# Patient Record
Sex: Female | Born: 1985 | Race: Black or African American | Hispanic: No | Marital: Single | Smoking: Former smoker
Health system: Southern US, Community
[De-identification: ages and names within clinical notes are randomized; demographics above are authoritative.]

## PROBLEM LIST (undated history)

## (undated) ENCOUNTER — Inpatient Hospital Stay (HOSPITAL_COMMUNITY): Payer: Self-pay

## (undated) DIAGNOSIS — Z789 Other specified health status: Secondary | ICD-10-CM

## (undated) DIAGNOSIS — I10 Essential (primary) hypertension: Secondary | ICD-10-CM

## (undated) HISTORY — PX: DILATION AND CURETTAGE OF UTERUS: SHX78

## (undated) HISTORY — PX: HERNIA REPAIR: SHX51

---
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
## 2004-11-02 ENCOUNTER — Inpatient Hospital Stay (HOSPITAL_COMMUNITY): Admission: AD | Admit: 2004-11-02 | Discharge: 2004-11-05 | Payer: Self-pay | Admitting: Obstetrics

## 2006-03-14 ENCOUNTER — Emergency Department (HOSPITAL_COMMUNITY): Admission: EM | Admit: 2006-03-14 | Discharge: 2006-03-14 | Payer: Self-pay | Admitting: Family Medicine

## 2006-03-16 ENCOUNTER — Emergency Department (HOSPITAL_COMMUNITY): Admission: EM | Admit: 2006-03-16 | Discharge: 2006-03-16 | Payer: Self-pay | Admitting: Family Medicine

## 2006-07-11 ENCOUNTER — Ambulatory Visit (HOSPITAL_COMMUNITY): Admission: RE | Admit: 2006-07-11 | Discharge: 2006-07-11 | Payer: Self-pay | Admitting: Obstetrics

## 2006-09-23 ENCOUNTER — Ambulatory Visit (HOSPITAL_COMMUNITY): Admission: RE | Admit: 2006-09-23 | Discharge: 2006-09-23 | Payer: Self-pay | Admitting: Obstetrics & Gynecology

## 2006-10-22 ENCOUNTER — Inpatient Hospital Stay (HOSPITAL_COMMUNITY): Admission: AD | Admit: 2006-10-22 | Discharge: 2006-10-22 | Payer: Self-pay | Admitting: Obstetrics & Gynecology

## 2006-11-03 ENCOUNTER — Inpatient Hospital Stay (HOSPITAL_COMMUNITY): Admission: AD | Admit: 2006-11-03 | Discharge: 2006-11-05 | Payer: Self-pay | Admitting: Obstetrics & Gynecology

## 2007-10-21 ENCOUNTER — Emergency Department (HOSPITAL_COMMUNITY): Admission: EM | Admit: 2007-10-21 | Discharge: 2007-10-21 | Payer: Self-pay | Admitting: Family Medicine

## 2007-11-24 IMAGING — US US OB FOLLOW-UP
1 series · 13 of 28 positions shown · non-contrast
Comparison: none

CLINICAL DATA: 19-year-old, gravida 3, para 1 with LMP of 02/03/06.  Evaluate growth.

[Series 1: us ob follow-up · 0.35mm/px · 13 of 38 slices shown]
[im 2/38]
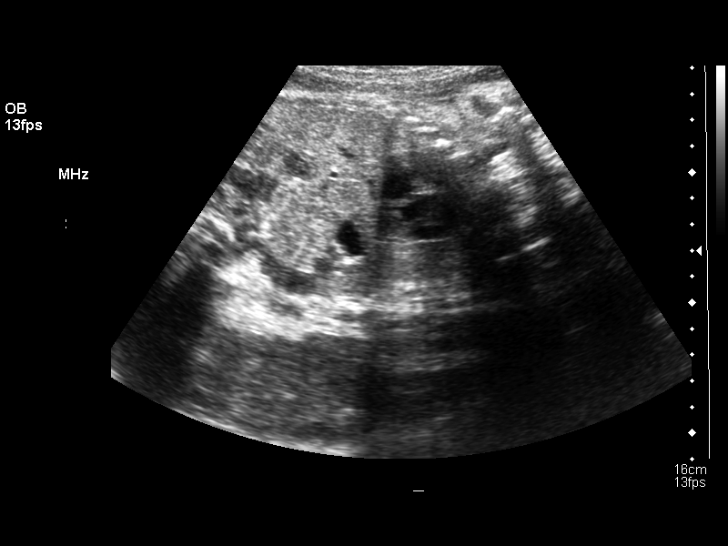
[im 5/38]
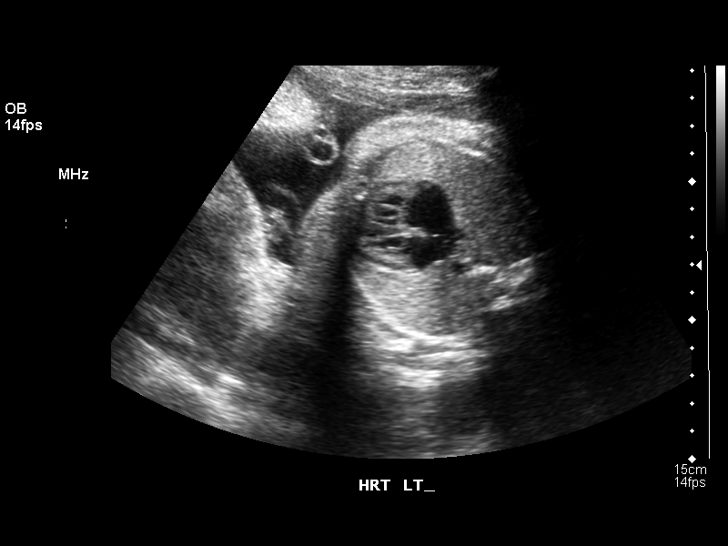
[im 7/38]
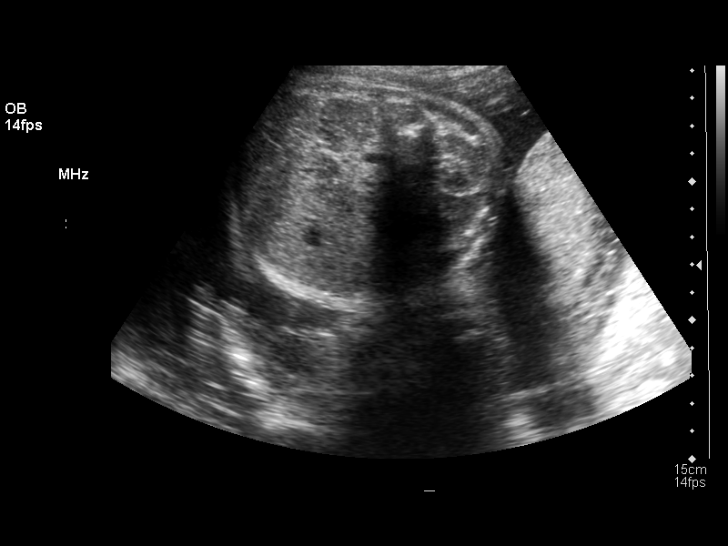
[im 10/38]
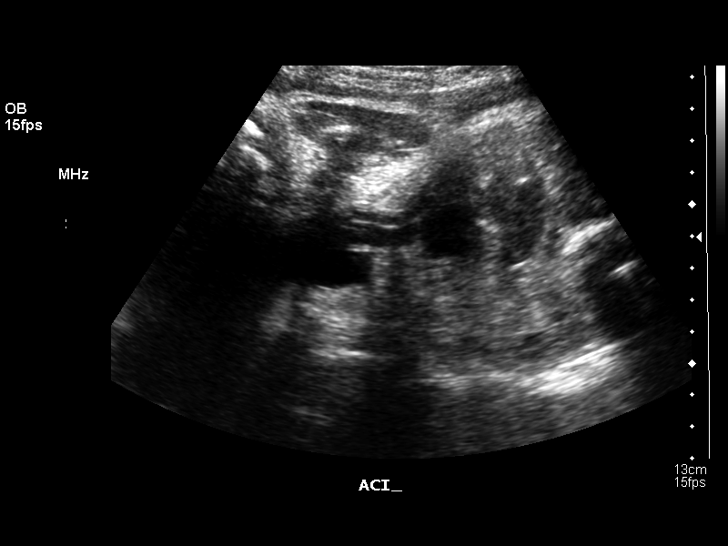
[im 13/38]
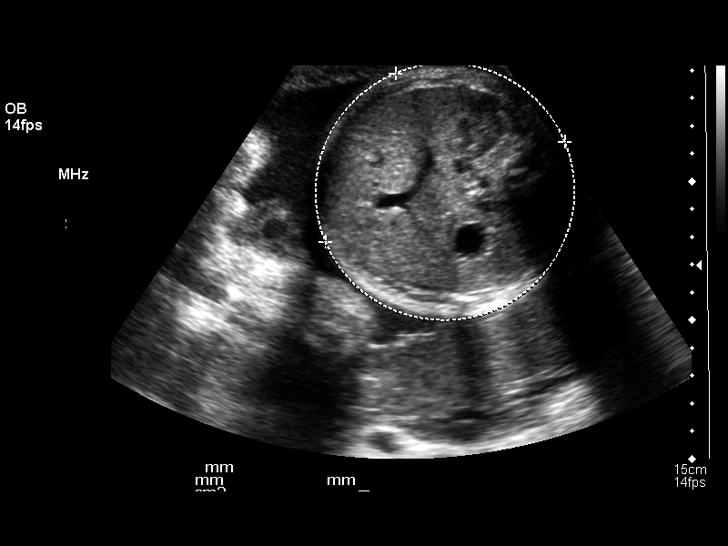
[im 16/38]
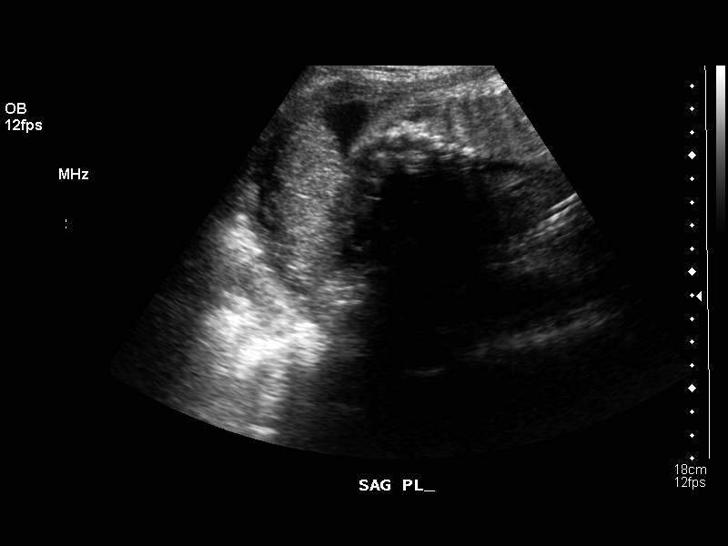
[im 20/38]
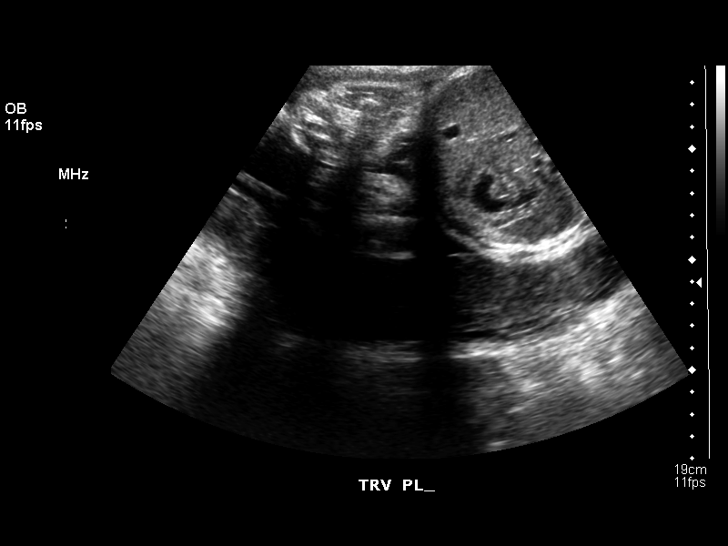
[im 22/38]
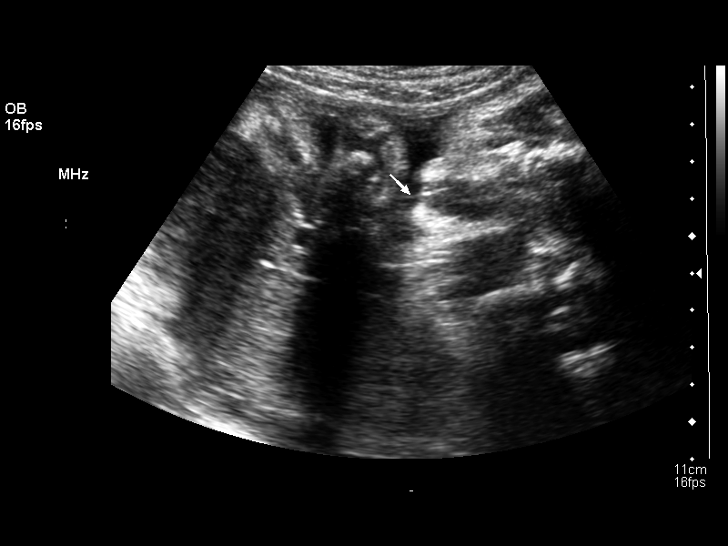
[im 25/38]
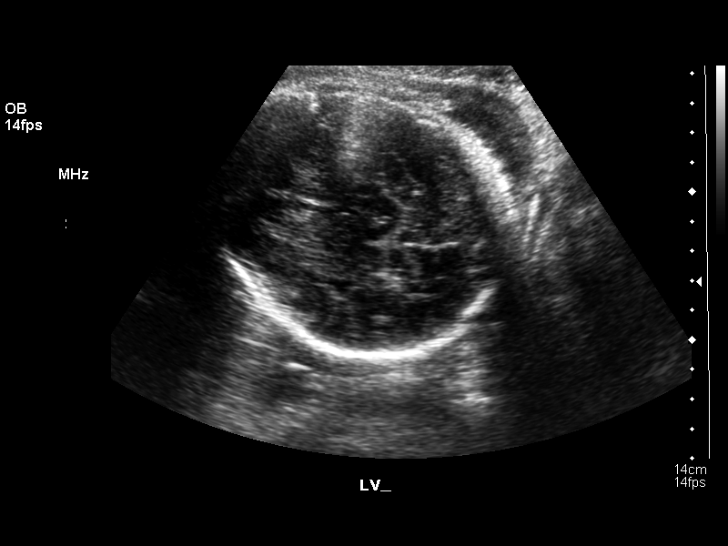
[im 28/38]
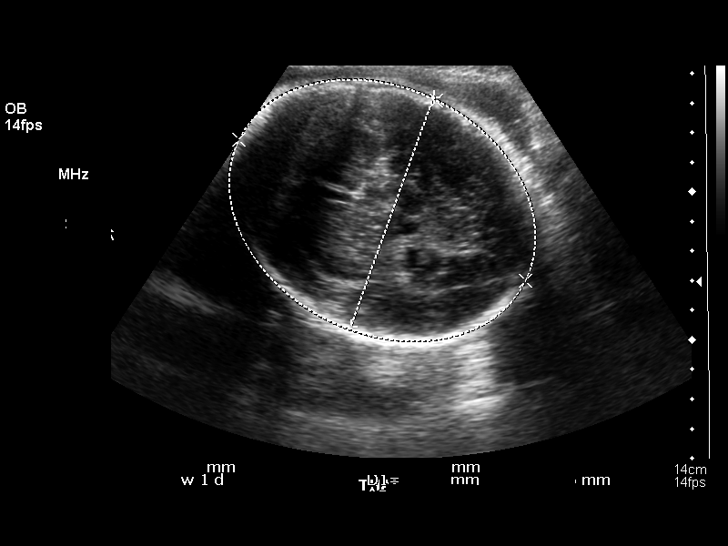
[im 31/38]
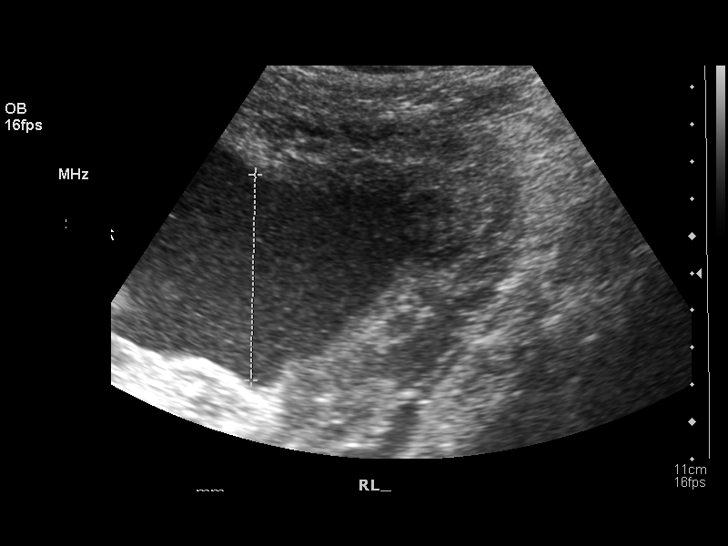
[im 33/38]
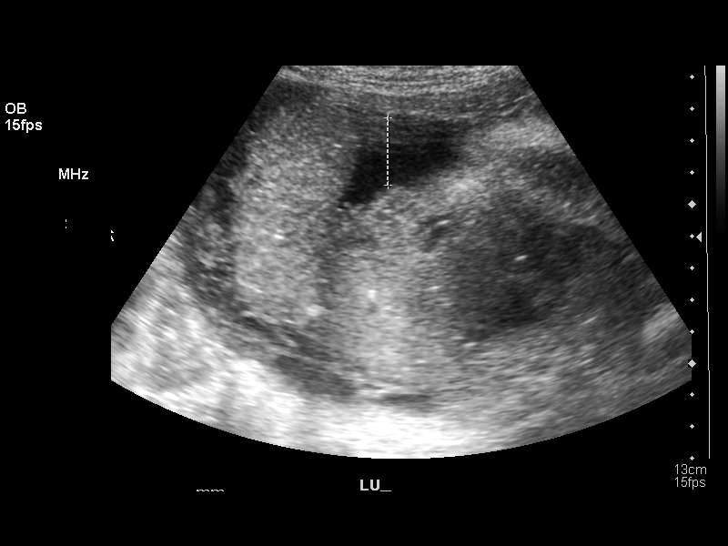
[im 36/38]
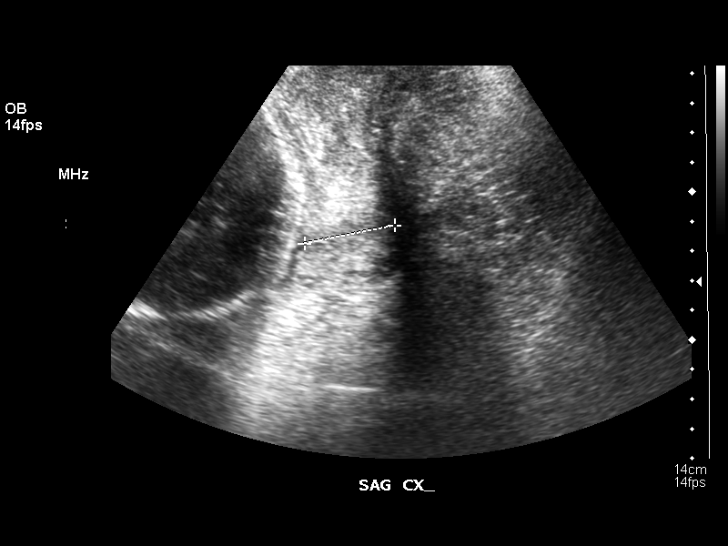

[13 of 28 positions shown; findings below may reference images not displayed]

OBSTETRICAL ULTRASOUND RE-EVALUATION:
 Number of Fetuses:  1
 Heart Rate:  131 bpm
 Movement:  Yes
 Breathing:  Yes
 Presentation:  Cephalic
 Placental Location:  Posterior
 Grade:  I
 Previa:  No
 Amniotic Fluid (subjective):  Normal
 Amniotic Fluid (objective):  AFI 13.2 cm (9th-39th %ile = 8.3 to 24.5 cm for 33 weeks) 

 FETAL BIOMETRY
 BPD:  8.2 cm   33 w 1 d 
 HC:  29.9 cm   33 w 1 d 
 AC:  29.5 cm   33 w 3 d 
 FL:  6.6 cm   34 w 1 d 

 Mean GA:  33 w 3 d     US EDC:  11/08/06
 Assigned GA:  33 w 1 d   Assigned EDC:  11/10/06

 EFW:  2270 grams 50th ? 75th %ile (0393 ? 7787 g) for 33 weeks 

 FETAL ANATOMY
 Lateral Ventricles:  Visualized   
 Thalami/CSP:  Visualized 
 Posterior Fossa:  Previously visualized 
 Nuchal Region:  Previously visualized 
 Spine:  Previously visualized 
 4 Chamber Heart on Left:  Previously visualized 
 Stomach on Left:  Visualized 
 3 Vessel Cord:  Visualized 
 Cord Insertion Site:  Previously visualized 
 Kidneys:  Visualized 
 Bladder:    Visualized 
 Extremities:  Previously visualized 

 ADDITIONAL ANATOMY VISUALIZED:  Upper lip and diaphragm.   

 MATERNAL UTERINE AND ADNEXAL FINDINGS
 Cervix:  3.1 cm translabial.
IMPRESSION: Single living intrauterine fetus in cephalic presentation.  Amniotic fluid index is within normal limits.  There has been appropriate interval growth.

## 2009-07-26 ENCOUNTER — Inpatient Hospital Stay (HOSPITAL_COMMUNITY): Admission: AD | Admit: 2009-07-26 | Discharge: 2009-07-26 | Payer: Self-pay | Admitting: Family Medicine

## 2009-09-08 ENCOUNTER — Ambulatory Visit (HOSPITAL_COMMUNITY): Admission: RE | Admit: 2009-09-08 | Discharge: 2009-09-08 | Payer: Self-pay | Admitting: Obstetrics

## 2010-01-25 ENCOUNTER — Inpatient Hospital Stay (HOSPITAL_COMMUNITY): Admission: AD | Admit: 2010-01-25 | Discharge: 2010-01-25 | Payer: Self-pay | Admitting: Obstetrics

## 2010-02-08 ENCOUNTER — Inpatient Hospital Stay (HOSPITAL_COMMUNITY): Admission: AD | Admit: 2010-02-08 | Discharge: 2010-02-10 | Payer: Self-pay | Admitting: Obstetrics

## 2010-11-09 IMAGING — US US OB DETAIL+14 WK
1 series · 14 of 28 positions shown · non-contrast
Comparison: none

OBSTETRICAL ULTRASOUND:
 This ultrasound exam was performed in the [HOSPITAL] Ultrasound Department.  The OB US report was generated in the AS system, and faxed to the ordering physician.  This report is also available in [HOSPITAL]?s [employer] and in [REDACTED] PACS.

[Series 1: us ob detail +14 wk · 0.27mm/px · 14 of 89 slices shown]
[im 4/89]
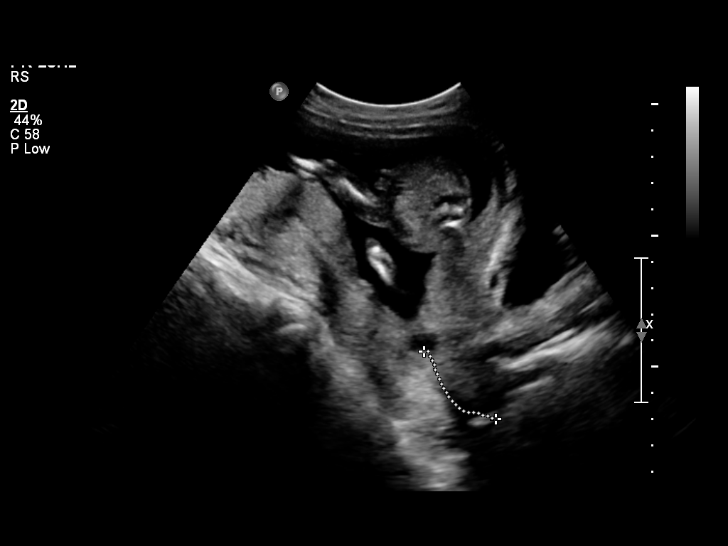
[im 10/89]
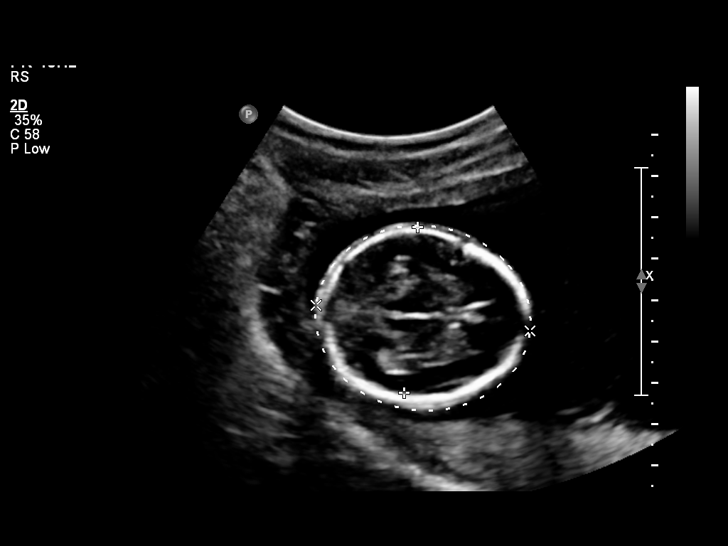
[im 17/89]
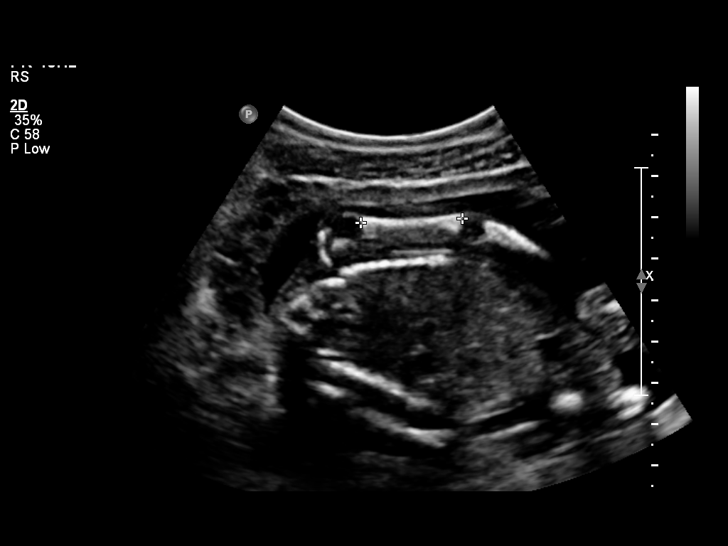
[im 23/89]
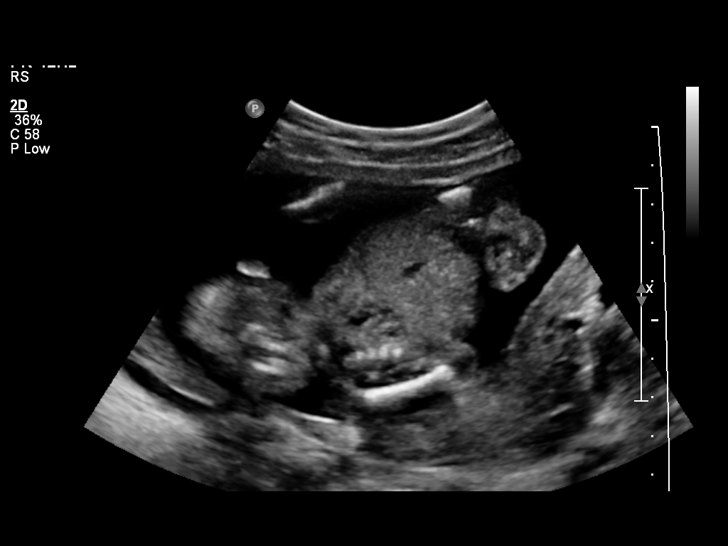
[im 30/89]
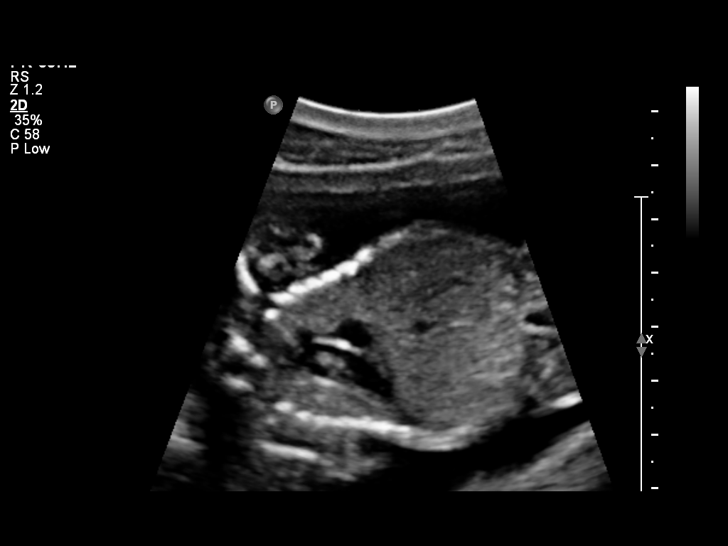
[im 36/89]
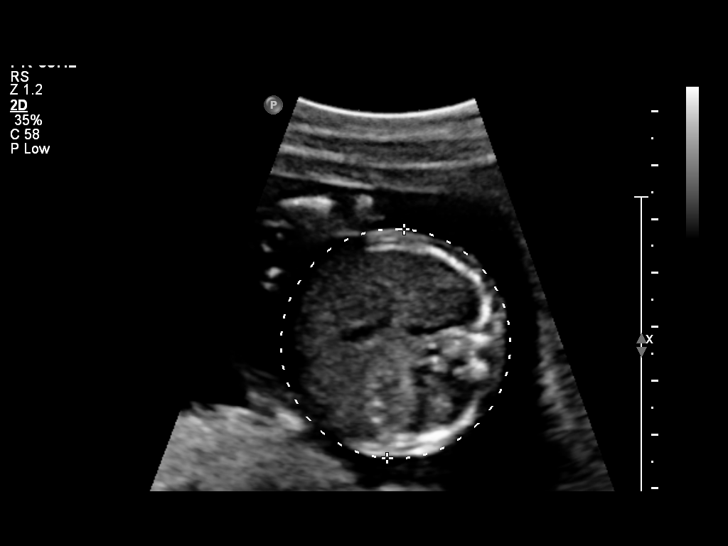
[im 43/89]
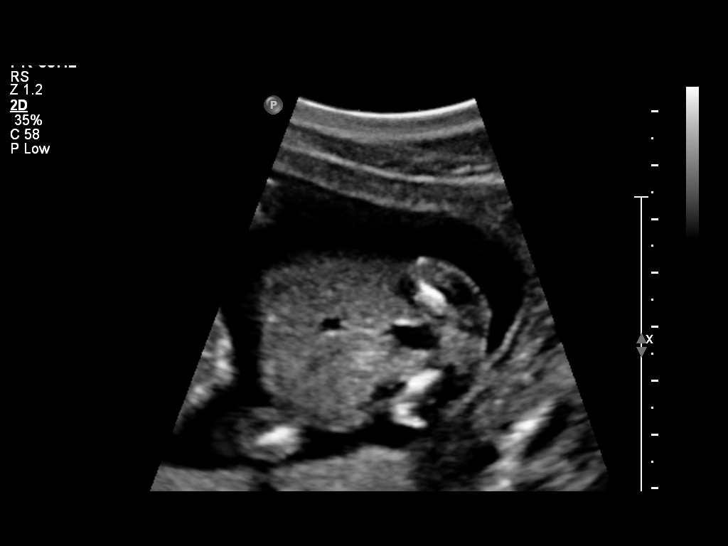
[im 49/89]
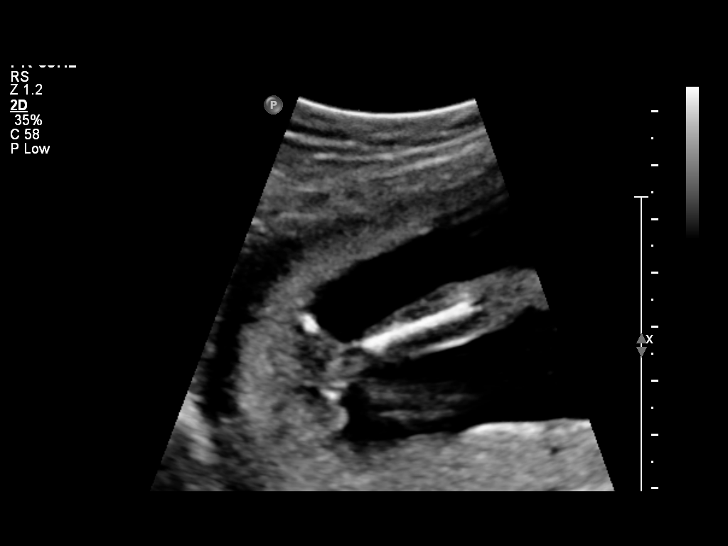
[im 56/89]
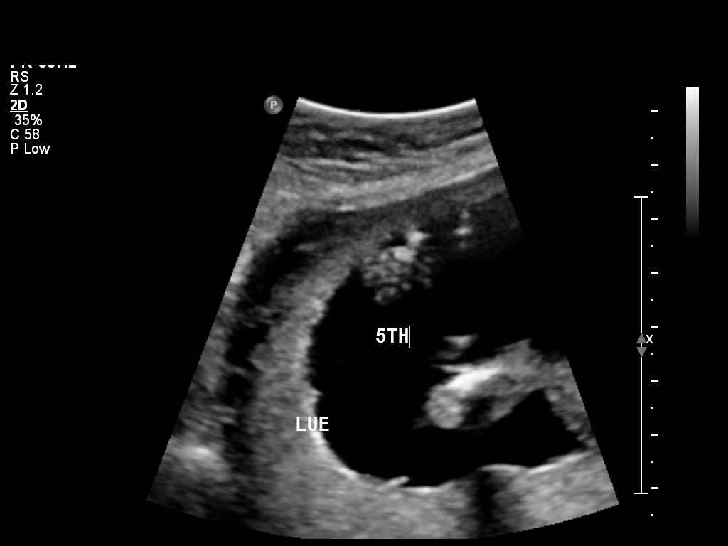
[im 62/89]
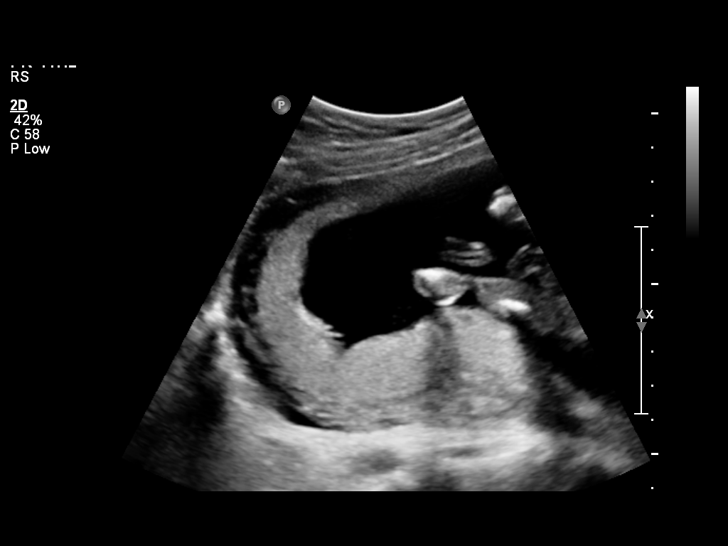
[im 69/89]
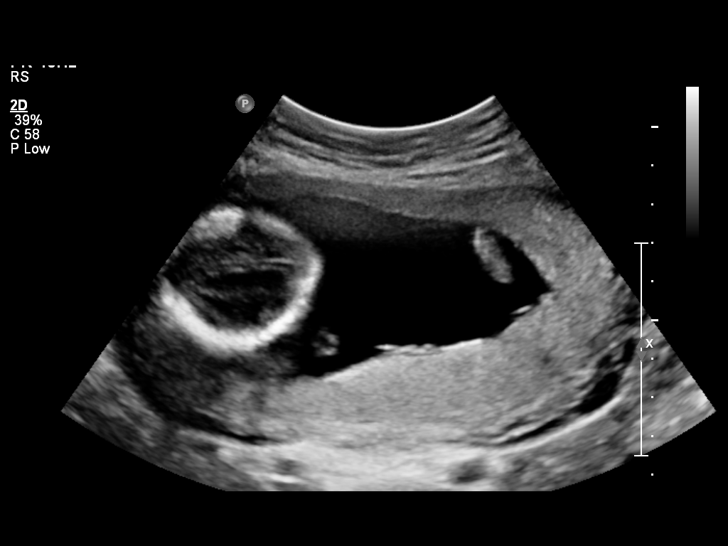
[im 75/89]
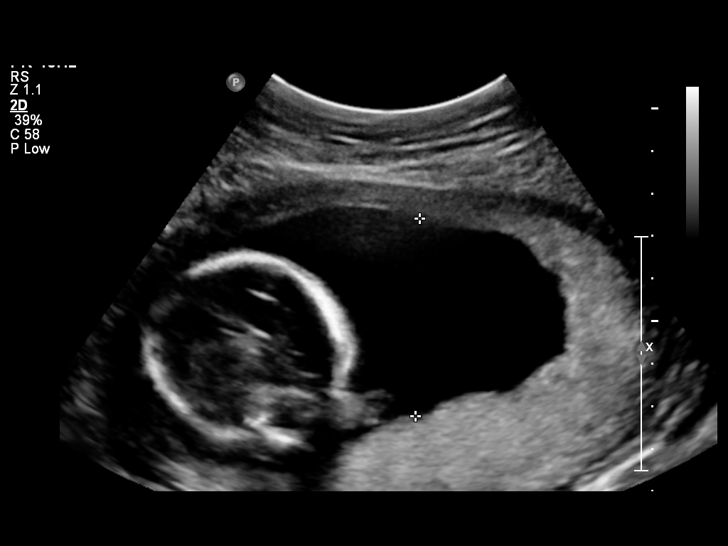
[im 82/89]
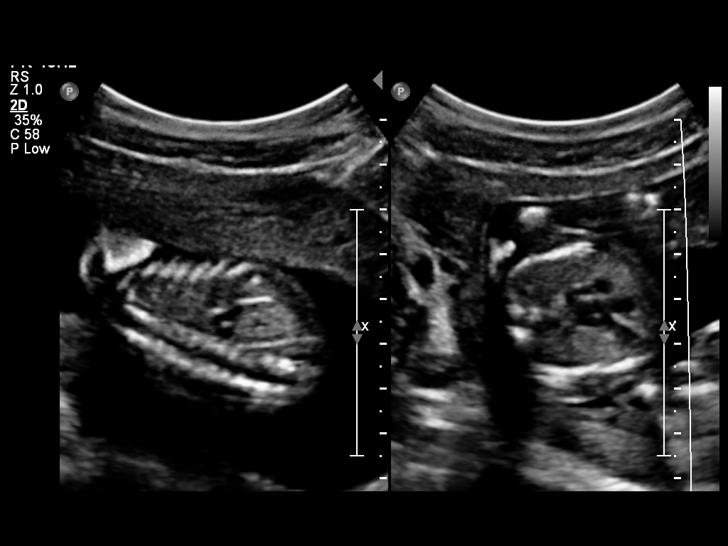
[im 89/89]
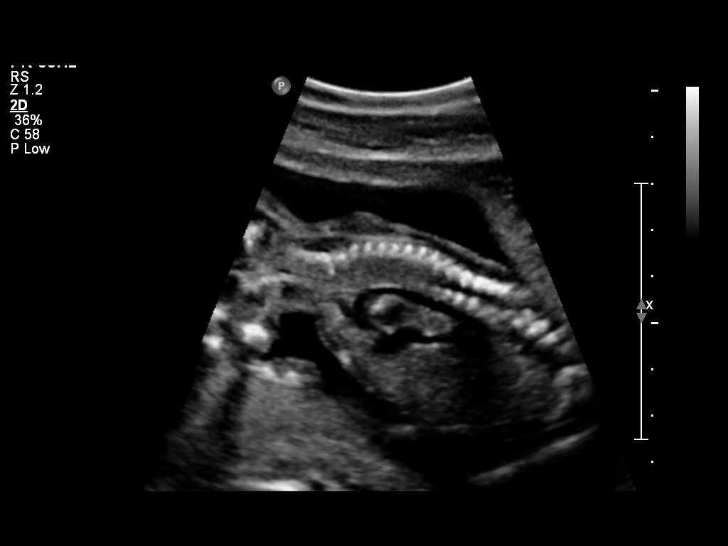

[14 of 28 positions shown; findings below may reference images not displayed]

IMPRESSION: See AS Obstetric US report.

## 2011-02-14 LAB — CBC
HCT: 33.5 % — ABNORMAL LOW (ref 36.0–46.0)
Hemoglobin: 10.9 g/dL — ABNORMAL LOW (ref 12.0–15.0)
Hemoglobin: 11 g/dL — ABNORMAL LOW (ref 12.0–15.0)
MCHC: 32.6 g/dL (ref 30.0–36.0)
MCV: 91.4 fL (ref 78.0–100.0)
Platelets: 174 10*3/uL (ref 150–400)
RBC: 3.6 MIL/uL — ABNORMAL LOW (ref 3.87–5.11)
RBC: 3.67 MIL/uL — ABNORMAL LOW (ref 3.87–5.11)
RDW: 13.3 % (ref 11.5–15.5)
WBC: 11 10*3/uL — ABNORMAL HIGH (ref 4.0–10.5)
WBC: 9.3 10*3/uL (ref 4.0–10.5)

## 2011-02-14 LAB — RPR: RPR Ser Ql: NONREACTIVE

## 2011-02-26 LAB — URINALYSIS, ROUTINE W REFLEX MICROSCOPIC
Glucose, UA: NEGATIVE mg/dL
Hgb urine dipstick: NEGATIVE
Specific Gravity, Urine: 1.015 (ref 1.005–1.030)
pH: 7 (ref 5.0–8.0)

## 2011-02-26 LAB — POCT PREGNANCY, URINE: Preg Test, Ur: POSITIVE

## 2011-04-09 NOTE — H&P (Signed)
Melissa Henderson, Melissa Henderson                 ACCOUNT NO.:  [id number]   MEDICAL RECORD NO.:  [id number]          PATIENT TYPE:  INP   LOCATION:  9174                          FACILITY:  [facility]   PHYSICIAN:  Roseanna Rainbow, M.D.DATE OF BIRTH:  12-01-1985   DATE OF ADMISSION:  11/03/2006  DATE OF DISCHARGE:                              HISTORY & PHYSICAL   CHIEF COMPLAINT:  The patient is a 25 year old gravida 3, para 1 with an  estimated date of confinement of November 10, 2006, and an intrauterine  pregnancy at 39 weeks complaining of ruptured membranes   HISTORY OF PRESENT ILLNESS:  The patient had presented to the office  earlier today with similar complaints and was found to be Nitrazine  positive.  On exam in the office, the cervix was 2 cm dilated, 60%  effaced.   ALLERGIES:  No known drug allergies.   MEDICATIONS:  See the reconciliation form.   OB RISK FACTORS:  Late onset of care and limited care.   PRENATAL LABS:  Chlamydia probe negative.  GC probe negative.  Urine  culture and sensitivity, no uropathogens.  Hepatitis B surface antigen  negative.  Hematocrit 36.7, hemoglobin 11.8.  HIV nonreactive.  Blood  type is 0-positive.  Antibody screen negative.  Hematocrit 36.7,  hemoglobin 11.8.  RPR nonreactive.  Rubella immune.  Platelets 260,000.  Sickle cell negative.  Pap smear negative.   PAST GYN HISTORY:  Noncontributory.   PAST MEDICAL HISTORY:  No significant history of medical diseases.   PAST SURGICAL HISTORY:  No previous surgery.   SOCIAL HISTORY:  She does not give any significant history of alcohol  use and she has no significant smoking history.  Denies illicit drug  use.   FAMILY HISTORY:  Adult-onset diabetes mellitus, hypertension, heart  disease, cervical cancer.   PAST OB HISTORY:  In November 2006, she had a voluntary termination of  pregnancy.  In December 2005, she was delivered of a live born female, 5  pounds 3 ounces complicated by PIH at  term.   PHYSICAL EXAM:  VITAL SIGNS:  Stable, afebrile.  Fetal heart tracing  reassuring. Tocodynamometer: irregular uterine contractions.  GENERAL:  No apparent distress.  STERILE VAGINAL EXAM:  Please see the above.   ASSESSMENT:  Primipara at term early labor, fetal heart tracing  consistent with fetal well-being .   PLAN:  Monitor progress, possible augmentation of labor with low-dose  Pitocin per protocol.  We will administered GBS prophylaxis for risk  factors as her GBS status is unknown.      Roseanna Rainbow, M.D.  Electronically Signed     LAJ/MEDQ  D:  11/03/2006  T:  11/03/2006  Job:  [id number]

## 2011-10-20 ENCOUNTER — Emergency Department (HOSPITAL_COMMUNITY)
Admission: EM | Admit: 2011-10-20 | Discharge: 2011-10-20 | Disposition: A | Discharge status: Home / Self Care | Payer: Medicaid Other | Attending: Emergency Medicine | Admitting: Emergency Medicine

## 2011-10-20 DIAGNOSIS — R109 Unspecified abdominal pain: Secondary | ICD-10-CM | POA: Insufficient documentation

## 2011-10-20 DIAGNOSIS — H6090 Unspecified otitis externa, unspecified ear: Secondary | ICD-10-CM

## 2011-10-20 DIAGNOSIS — H60399 Other infective otitis externa, unspecified ear: Secondary | ICD-10-CM | POA: Insufficient documentation

## 2011-10-20 DIAGNOSIS — N760 Acute vaginitis: Secondary | ICD-10-CM | POA: Insufficient documentation

## 2011-10-20 DIAGNOSIS — A499 Bacterial infection, unspecified: Secondary | ICD-10-CM | POA: Insufficient documentation

## 2011-10-20 DIAGNOSIS — B9689 Other specified bacterial agents as the cause of diseases classified elsewhere: Secondary | ICD-10-CM | POA: Insufficient documentation

## 2011-10-20 LAB — URINE MICROSCOPIC-ADD ON

## 2011-10-20 LAB — URINALYSIS, ROUTINE W REFLEX MICROSCOPIC
Leukocytes, UA: NEGATIVE
Nitrite: NEGATIVE
Specific Gravity, Urine: 1.035 — ABNORMAL HIGH (ref 1.005–1.030)
Urobilinogen, UA: 1 mg/dL (ref 0.0–1.0)
pH: 8 (ref 5.0–8.0)

## 2011-10-20 LAB — DIFFERENTIAL
Basophils Relative: 0 % (ref 0–1)
Lymphs Abs: 2.3 10*3/uL (ref 0.7–4.0)
Monocytes Relative: 10 % (ref 3–12)
Neutro Abs: 3.1 10*3/uL (ref 1.7–7.7)
Neutrophils Relative %: 51 % (ref 43–77)

## 2011-10-20 LAB — CBC
Hemoglobin: 11.8 g/dL — ABNORMAL LOW (ref 12.0–15.0)
MCHC: 33.3 g/dL (ref 30.0–36.0)
Platelets: 231 10*3/uL (ref 150–400)
RBC: 3.93 MIL/uL (ref 3.87–5.11)

## 2011-10-20 LAB — WET PREP, GENITAL

## 2011-10-20 MED ORDER — NEOMYCIN-POLYMYXIN-HC 3.5-10000-1 OT SUSP: 4.0000 [drp] | Freq: Four times a day (QID) | OTIC | Status: AC

## 2011-10-20 MED ORDER — HYDROCODONE-ACETAMINOPHEN 5-500 MG PO TABS: 1.0000 | ORAL_TABLET | Freq: Four times a day (QID) | ORAL | Status: AC | PRN

## 2011-10-20 MED ORDER — METRONIDAZOLE 500 MG PO TABS: 500.0000 mg | ORAL_TABLET | Freq: Two times a day (BID) | ORAL | Status: AC

## 2011-10-20 MED ORDER — NEOMYCIN-POLYMYXIN-HC 3.5-10000-1 OT SUSP: 4.0000 [drp] | Freq: Four times a day (QID) | OTIC | Status: DC

## 2011-10-20 NOTE — ED Notes (Signed)
Pt alert, c/o right ear pain, onset several daysa go, c/o right "ovary pain" since  last pm, pt resp even unlabored, skin pwd, no s/s distress or discomfort noted

## 2011-10-20 NOTE — ED Provider Notes (Signed)
History     CSN: [id number] Arrival date & time: 10/20/2011  6:38 PM   First MD Initiated Contact with Patient 10/20/11 1944      Chief Complaint  Patient presents with  . Abdominal Pain    RLQ since last night.  denies fever, n/v/d or painful urination  . Ear Drainage  . Otalgia    (Consider location/radiation/quality/duration/timing/severity/associated sxs/prior treatment) HPI Comments: Also complains of pain and swelling in the right ear.  She reports a malodorous drainage from the ear.  Patient is a 25 y.o. female presenting with abdominal pain, ear drainage, and ear pain. The history is provided by the patient.  Abdominal Pain The primary symptoms of the illness include abdominal pain. The primary symptoms of the illness do not include fever, fatigue, nausea, vomiting, diarrhea, dysuria, vaginal discharge or vaginal bleeding. The current episode started yesterday. The onset of the illness was gradual. The problem has been gradually worsening.  The patient states that she believes she is currently not pregnant. The patient has not had a change in bowel habit. Symptoms associated with the illness do not include chills, constipation, urgency, hematuria, frequency or back pain.  Ear Drainage Associated symptoms include abdominal pain.  Otalgia Associated symptoms include abdominal pain. Pertinent negatives include no diarrhea and no vomiting.    History reviewed. No pertinent past medical history.  History reviewed. No pertinent past surgical history.  History reviewed. No pertinent family history.  History  Substance Use Topics  . Smoking status: Current Everyday Smoker    Types: Cigarettes  . Smokeless tobacco: Not on file   Comment: smokes 2 cigs/day  . Alcohol Use: Yes     occasionally    OB History    Grav Para Term Preterm Abortions TAB SAB Ect Mult Living                  Review of Systems  Constitutional: Negative for fever, chills and fatigue.  HENT:  Positive for ear pain.   Gastrointestinal: Positive for abdominal pain. Negative for nausea, vomiting, diarrhea and constipation.  Genitourinary: Negative for dysuria, urgency, frequency, hematuria, vaginal bleeding and vaginal discharge.  Musculoskeletal: Negative for back pain.  All other systems reviewed and are negative.    Allergies  Review of patient's allergies indicates no known allergies.  Home Medications  No current outpatient prescriptions on file.  BP 131/80  Pulse 61  Temp(Src) 98.6 F (37 C) (Oral)  Resp 16  Wt 132 lb (59.875 kg)  SpO2 100%  LMP 10/07/2011  Physical Exam  Nursing note and vitals reviewed. Constitutional: She is oriented to person, place, and time. She appears well-developed and well-nourished. No distress.  HENT:  Head: Normocephalic and atraumatic.  Neck: Normal range of motion. Neck supple.  Cardiovascular: Normal rate and regular rhythm.  Exam reveals no gallop and no friction rub.   No murmur heard. Pulmonary/Chest: Effort normal and breath sounds normal. No respiratory distress. She has no wheezes.  Abdominal: Soft. Bowel sounds are normal. She exhibits no distension. There is tenderness.       There is ttp in the right lower quadrant.  No rebound or guarding.    Musculoskeletal: Normal range of motion.  Neurological: She is alert and oriented to person, place, and time.  Skin: Skin is warm and dry. She is not diaphoretic.    ED Course  Procedures (including critical care time)   Labs Reviewed  URINALYSIS, ROUTINE W REFLEX MICROSCOPIC  POCT PREGNANCY, URINE  CBC  DIFFERENTIAL   No results found.   No diagnosis found.    MDM  No wbc, appendicitis less likely.  Will treat with flagyl, pain meds and is to return prn if worsens.        Geoffery Lyons, MD 10/20/11 2213

## 2011-10-21 LAB — GC/CHLAMYDIA PROBE AMP, GENITAL: Chlamydia, DNA Probe: NEGATIVE

## 2012-06-14 ENCOUNTER — Emergency Department (HOSPITAL_COMMUNITY)
Admission: EM | Admit: 2012-06-14 | Discharge: 2012-06-14 | Disposition: A | Discharge status: Home / Self Care | Payer: Self-pay | Attending: Emergency Medicine | Admitting: Emergency Medicine

## 2012-06-14 ENCOUNTER — Emergency Department (HOSPITAL_COMMUNITY): Payer: Self-pay

## 2012-06-14 ENCOUNTER — Encounter (HOSPITAL_COMMUNITY): Payer: Self-pay | Admitting: *Deleted

## 2012-06-14 DIAGNOSIS — R112 Nausea with vomiting, unspecified: Secondary | ICD-10-CM

## 2012-06-14 DIAGNOSIS — Z331 Pregnant state, incidental: Secondary | ICD-10-CM | POA: Insufficient documentation

## 2012-06-14 DIAGNOSIS — N949 Unspecified condition associated with female genital organs and menstrual cycle: Secondary | ICD-10-CM | POA: Insufficient documentation

## 2012-06-14 DIAGNOSIS — Z3490 Encounter for supervision of normal pregnancy, unspecified, unspecified trimester: Secondary | ICD-10-CM

## 2012-06-14 DIAGNOSIS — O219 Vomiting of pregnancy, unspecified: Secondary | ICD-10-CM | POA: Insufficient documentation

## 2012-06-14 DIAGNOSIS — N760 Acute vaginitis: Secondary | ICD-10-CM | POA: Insufficient documentation

## 2012-06-14 DIAGNOSIS — R10813 Right lower quadrant abdominal tenderness: Secondary | ICD-10-CM | POA: Insufficient documentation

## 2012-06-14 DIAGNOSIS — A499 Bacterial infection, unspecified: Secondary | ICD-10-CM | POA: Insufficient documentation

## 2012-06-14 DIAGNOSIS — B9689 Other specified bacterial agents as the cause of diseases classified elsewhere: Secondary | ICD-10-CM | POA: Insufficient documentation

## 2012-06-14 DIAGNOSIS — O2390 Unspecified genitourinary tract infection in pregnancy, unspecified trimester: Secondary | ICD-10-CM | POA: Insufficient documentation

## 2012-06-14 LAB — POCT I-STAT, CHEM 8
BUN: 5 mg/dL — ABNORMAL LOW (ref 6–23)
Chloride: 102 mEq/L (ref 96–112)
Creatinine, Ser: 0.9 mg/dL (ref 0.50–1.10)
Glucose, Bld: 104 mg/dL — ABNORMAL HIGH (ref 70–99)
Hemoglobin: 13.6 g/dL (ref 12.0–15.0)
Potassium: 3.8 mEq/L (ref 3.5–5.1)
Sodium: 136 mEq/L (ref 135–145)

## 2012-06-14 LAB — WET PREP, GENITAL: Trich, Wet Prep: NONE SEEN

## 2012-06-14 LAB — URINE MICROSCOPIC-ADD ON

## 2012-06-14 LAB — CBC
HCT: 37 % (ref 36.0–46.0)
Hemoglobin: 12.9 g/dL (ref 12.0–15.0)
RBC: 4.32 MIL/uL (ref 3.87–5.11)
WBC: 5.7 10*3/uL (ref 4.0–10.5)

## 2012-06-14 LAB — URINALYSIS, ROUTINE W REFLEX MICROSCOPIC
Glucose, UA: NEGATIVE mg/dL
Ketones, ur: 40 mg/dL — AB
Nitrite: NEGATIVE
Specific Gravity, Urine: 1.028 (ref 1.005–1.030)
pH: 6.5 (ref 5.0–8.0)

## 2012-06-14 LAB — HCG, QUANTITATIVE, PREGNANCY: hCG, Beta Chain, Quant, S: 127751 m[IU]/mL — ABNORMAL HIGH (ref <5)

## 2012-06-14 MED ORDER — PRENATAL COMPLETE 14-0.4 MG PO TABS: 1.0000 | ORAL_TABLET | Freq: Every day | ORAL | Status: DC

## 2012-06-14 MED ORDER — ONDANSETRON HCL 4 MG PO TABS: 4.0000 mg | ORAL_TABLET | Freq: Three times a day (TID) | ORAL | Status: AC | PRN

## 2012-06-14 MED ORDER — METRONIDAZOLE 500 MG PO TABS: 500.0000 mg | ORAL_TABLET | Freq: Two times a day (BID) | ORAL | Status: AC

## 2012-06-14 MED ORDER — ONDANSETRON 4 MG PO TBDP
8.0000 mg | ORAL_TABLET | Freq: Once | ORAL | Status: AC
  Administered 2012-06-14: 8 mg via ORAL
  Filled 2012-06-14: qty 2

## 2012-06-14 NOTE — ED Provider Notes (Signed)
Medical screening examination/treatment/procedure(s) were performed by non-physician practitioner and as supervising physician I was immediately available for consultation/collaboration.  Cheri Guppy, MD 06/14/12 2003

## 2012-06-14 NOTE — ED Provider Notes (Signed)
History     CSN: [id number]  Arrival date & time 06/14/12  1150   First MD Initiated Contact with Patient 06/14/12 1241      Chief Complaint  Patient presents with  . Abdominal Pain  . Emesis    (Consider location/radiation/quality/duration/timing/severity/associated sxs/prior treatment) HPI Comments: Patient comes in today with a chief complaint of right sided pelvic pain.  She reports that the pain has been present intermittently over the past week, but worse over the past 2 days.  She describes the pain today as sharp.  She also reports that she has been vomiting intermittently over the past 3 weeks.  LMP was 04/20/12.  She reports that her period at that time was normal.  She typically has regular periods.  She has not taken a pregnancy test prior to arrival.  She is G6P3.  She has had two prior elective abortions and one prior spontaneous abortion.  She currently has three living children.  No prior history of ectopic pregnancy.  However, she does have a history of PID.  She denies any fever or chills.  She reports that she has been having whitish color vaginal discharge.    Patient is a 26 y.o. female presenting with abdominal pain and vomiting. The history is provided by the patient.  Abdominal Pain The primary symptoms of the illness include abdominal pain, fatigue, nausea, vomiting and vaginal discharge. The primary symptoms of the illness do not include fever, diarrhea, hematemesis, dysuria or vaginal bleeding.  The vaginal discharge is not associated with dysuria.  Symptoms associated with the illness do not include chills, constipation or hematuria.  Emesis  Associated symptoms include abdominal pain. Pertinent negatives include no chills, no diarrhea and no fever.    History reviewed. No pertinent past medical history.  History reviewed. No pertinent past surgical history.  History reviewed. No pertinent family history.  History  Substance Use Topics  . Smoking status:  Current Everyday Smoker    Types: Cigarettes  . Smokeless tobacco: Not on file   Comment: smokes 2 cigs/day  . Alcohol Use: Yes     occasionally    OB History    Grav Para Term Preterm Abortions TAB SAB Ect Mult Living                  Review of Systems  Constitutional: Positive for fatigue. Negative for fever and chills.  Gastrointestinal: Positive for nausea, vomiting and abdominal pain. Negative for diarrhea, constipation, blood in stool and hematemesis.  Genitourinary: Positive for vaginal discharge and pelvic pain. Negative for dysuria, hematuria, decreased urine volume, vaginal bleeding and difficulty urinating.  Neurological: Negative for dizziness, syncope and light-headedness.    Allergies  Review of patient's allergies indicates no known allergies.  Home Medications  No current outpatient prescriptions on file.  BP 112/66  Pulse 59  Temp 98.2 F (36.8 C) (Oral)  Resp 18  SpO2 100%  Physical Exam  Nursing note and vitals reviewed. Constitutional: She appears well-developed and well-nourished. No distress.  HENT:  Head: Normocephalic and atraumatic.  Mouth/Throat: Oropharynx is clear and moist.  Cardiovascular: Normal rate, regular rhythm and normal heart sounds.   Pulmonary/Chest: Effort normal and breath sounds normal. No respiratory distress. She has no wheezes.  Abdominal: Soft. Bowel sounds are normal. There is tenderness in the right lower quadrant. There is no rigidity, no rebound and no guarding.  Genitourinary: There is no rash, tenderness or lesion on the right labia. There is no rash, tenderness or  lesion on the left labia. Cervix exhibits no motion tenderness. Right adnexum displays tenderness. Right adnexum displays no mass and no fullness. Left adnexum displays no mass, no tenderness and no fullness. Vaginal discharge found.       Whitish color discharge in the vaginal vault Cervical os closed.  Neurological: She is alert.  Skin: Skin is warm and  dry. She is not diaphoretic.  Psychiatric: She has a normal mood and affect.    ED Course  Procedures (including critical care time)  Labs Reviewed  URINALYSIS, ROUTINE W REFLEX MICROSCOPIC - Abnormal; Notable for the following:    Color, Urine AMBER (*)  BIOCHEMICALS MAY BE AFFECTED BY COLOR   APPearance CLOUDY (*)     Bilirubin Urine SMALL (*)     Ketones, ur 40 (*)     Protein, ur 30 (*)     Leukocytes, UA SMALL (*)     All other components within normal limits  POCT I-STAT, CHEM 8 - Abnormal; Notable for the following:    BUN 5 (*)     Glucose, Bld 104 (*)     Calcium, Ion 1.28 (*)     All other components within normal limits  POCT PREGNANCY, URINE - Abnormal; Notable for the following:    Preg Test, Ur POSITIVE (*)     All other components within normal limits  URINE MICROSCOPIC-ADD ON - Abnormal; Notable for the following:    Squamous Epithelial / LPF FEW (*)     Bacteria, UA FEW (*)     All other components within normal limits  CBC  GC/CHLAMYDIA PROBE AMP, GENITAL  WET PREP, GENITAL  HCG, QUANTITATIVE, PREGNANCY   US Ob Comp Less 14 Wks  06/14/2012  *RADIOLOGY REPORT*  Clinical Data: Right-sided pelvic pain. 7-week-6-day gestational age by LMP.  OBSTETRIC <14 WK [place] AND TRANSVAGINAL OB US  Technique:  Both transabdominal and transvaginal ultrasound examinations were performed for complete evaluation of the gestation as well as the maternal uterus, adnexal regions, and pelvic cul-de-sac.  Transvaginal technique was performed to assess early pregnancy.  Comparison:  None.  Intrauterine gestational sac:  Visualized/normal in shape. Yolk sac: visualized Embryo: Visualized Cardiac Activity: Visualized Heart Rate: 157 bpm  CRL: 15  mm  7 w  6 d         [place] EDC: 01/25/2013  Maternal uterus/adnexae: No subchorionic hemorrhage or uterine fibroids identified.  A simple right ovarian cyst is seen measuring 3.7 cm.  Small amount of free fluid noted.  Left ovary is normal in appearance.   IMPRESSION:  1.  Single living IUP measuring 7 weeks 6 days, which is concordant with LMP. 2.  3.7 cm right ovarian corpus luteum cyst.  No adnexal mass identified.  Original Report Authenticated By: Danae Orleans, M.D.   US Ob Transvaginal  06/14/2012  *RADIOLOGY REPORT*  Clinical Data: Right-sided pelvic pain. 7-week-6-day gestational age by LMP.  OBSTETRIC <14 WK [place] AND TRANSVAGINAL OB US  Technique:  Both transabdominal and transvaginal ultrasound examinations were performed for complete evaluation of the gestation as well as the maternal uterus, adnexal regions, and pelvic cul-de-sac.  Transvaginal technique was performed to assess early pregnancy.  Comparison:  None.  Intrauterine gestational sac:  Visualized/normal in shape. Yolk sac: visualized Embryo: Visualized Cardiac Activity: Visualized Heart Rate: 157 bpm  CRL: 15  mm  7 w  6 d         [place] EDC: 01/25/2013  Maternal uterus/adnexae: No subchorionic hemorrhage or  uterine fibroids identified.  A simple right ovarian cyst is seen measuring 3.7 cm.  Small amount of free fluid noted.  Left ovary is normal in appearance.  IMPRESSION:  1.  Single living IUP measuring 7 weeks 6 days, which is concordant with LMP. 2.  3.7 cm right ovarian corpus luteum cyst.  No adnexal mass identified.  Original Report Authenticated By: Danae Orleans, M.D.     1. Pregnancy   2. Bacterial vaginosis   3. Nausea and vomiting       MDM  Patient comes in today with a chief complaint or right sided pelvic pain.  Urine pregnancy positive.  Ultrasound therefore ordered to rule out Ectopic Pregnancy.  Ultrasound showed single living IUP and a right ovarian cyst.  Patient referred to Gynecology.        Pascal Lux Milroy, PA-C 06/15/12 2204

## 2012-06-14 NOTE — ED Notes (Signed)
To ED for eval of abd pain, weakness, and vomiting for the past 3 days. States she hasn't been able to eat due to the vomiting. LMP in May. Denies anyone else sick in family. C/p sharp lower abd pain. Skin w/d, resp e/u.

## 2012-06-14 NOTE — ED Provider Notes (Signed)
3:56 PM Pt in CDU holding for wet prep results, resolution of nausea, PO trial.  Sign out received from [employer], PA-C, at change of shift.  Patient is [id number], currently pregnant, found to have IUP 7wks, 6 days gestation.   Plan is for wet prep results, per Herbert Seta pt has white discharge on exam.  D/C home with zofran, obgyn (Dr Francoise Ceo) follow up.  Patient currently has taken zofran and is waiting for relief prior to PO trial.  Pt declined IVF.  Will continue to follow.    5:42 PM Pt is tolerating PO.  Is drinking and has eaten.  No further vomiting.  Discussed all results with patient.  Pt has BV - discussed safety/studies regarding flagyl with patient, per Up to Date.  Will prescribe flagyl, have advised that she talk with Dr Gaynell Face about this and any medications and that she follow up with him closely.  Patient verbalizes understanding and agrees with plan.  Pt given return precautions.    Results for orders placed during the hospital encounter of 06/14/12  URINALYSIS, ROUTINE W REFLEX MICROSCOPIC      Component Value Range   Color, Urine AMBER (*) YELLOW   APPearance CLOUDY (*) CLEAR   Specific Gravity, Urine 1.028  1.005 - 1.030   pH 6.5  5.0 - 8.0   Glucose, UA NEGATIVE  NEGATIVE mg/dL   Hgb urine dipstick NEGATIVE  NEGATIVE   Bilirubin Urine SMALL (*) NEGATIVE   Ketones, ur 40 (*) NEGATIVE mg/dL   Protein, ur 30 (*) NEGATIVE mg/dL   Urobilinogen, UA 1.0  0.0 - 1.0 mg/dL   Nitrite NEGATIVE  NEGATIVE   Leukocytes, UA SMALL (*) NEGATIVE  CBC      Component Value Range   WBC 5.7  4.0 - 10.5 K/uL   RBC 4.32  3.87 - 5.11 MIL/uL   Hemoglobin 12.9  12.0 - 15.0 g/dL   HCT 47.8  29.5 - 62.1 %   MCV 85.6  78.0 - 100.0 fL   MCH 29.9  26.0 - 34.0 pg   MCHC 34.9  30.0 - 36.0 g/dL   RDW 30.8  65.7 - 84.6 %   Platelets 261  150 - 400 K/uL  POCT I-STAT, CHEM 8      Component Value Range   Sodium 136  135 - 145 mEq/L   Potassium 3.8  3.5 - 5.1 mEq/L   Chloride 102  96 -  112 mEq/L   BUN 5 (*) 6 - 23 mg/dL   Creatinine, Ser 9.62  0.50 - 1.10 mg/dL   Glucose, Bld 952 (*) 70 - 99 mg/dL   Calcium, Ion 8.41 (*) 1.12 - 1.23 mmol/L   TCO2 20  0 - 100 mmol/L   Hemoglobin 13.6  12.0 - 15.0 g/dL   HCT 32.4  40.1 - 02.7 %  POCT PREGNANCY, URINE      Component Value Range   Preg Test, Ur POSITIVE (*) NEGATIVE  URINE MICROSCOPIC-ADD ON      Component Value Range   Squamous Epithelial / LPF FEW (*) RARE   WBC, UA 3-6  <3 WBC/hpf   RBC / HPF 0-2  <3 RBC/hpf   Bacteria, UA FEW (*) RARE   Urine-Other MUCOUS PRESENT    WET PREP, GENITAL      Component Value Range   Yeast Wet Prep HPF POC NONE SEEN  NONE SEEN   Trich, Wet Prep NONE SEEN  NONE SEEN   Clue Cells Wet  Prep HPF POC FEW (*) NONE SEEN   WBC, Wet Prep HPF POC FEW (*) NONE SEEN  HCG, QUANTITATIVE, PREGNANCY      Component Value Range   hCG, Beta Francene Finders [id number] (*) <5 mIU/mL   US Ob Comp Less 14 Wks  06/14/2012  *RADIOLOGY REPORT*  Clinical Data: Right-sided pelvic pain. 7-week-6-day gestational age by LMP.  OBSTETRIC <14 WK [place] AND TRANSVAGINAL OB US  Technique:  Both transabdominal and transvaginal ultrasound examinations were performed for complete evaluation of the gestation as well as the maternal uterus, adnexal regions, and pelvic cul-de-sac.  Transvaginal technique was performed to assess early pregnancy.  Comparison:  None.  Intrauterine gestational sac:  Visualized/normal in shape. Yolk sac: visualized Embryo: Visualized Cardiac Activity: Visualized Heart Rate: 157 bpm  CRL: 15  mm  7 w  6 d         [place] EDC: 01/25/2013  Maternal uterus/adnexae: No subchorionic hemorrhage or uterine fibroids identified.  A simple right ovarian cyst is seen measuring 3.7 cm.  Small amount of free fluid noted.  Left ovary is normal in appearance.  IMPRESSION:  1.  Single living IUP measuring 7 weeks 6 days, which is concordant with LMP. 2.  3.7 cm right ovarian corpus luteum cyst.  No adnexal mass identified.  Original  Report Authenticated By: Danae Orleans, M.D.   US Ob Transvaginal  06/14/2012  *RADIOLOGY REPORT*  Clinical Data: Right-sided pelvic pain. 7-week-6-day gestational age by LMP.  OBSTETRIC <14 WK [place] AND TRANSVAGINAL OB US  Technique:  Both transabdominal and transvaginal ultrasound examinations were performed for complete evaluation of the gestation as well as the maternal uterus, adnexal regions, and pelvic cul-de-sac.  Transvaginal technique was performed to assess early pregnancy.  Comparison:  None.  Intrauterine gestational sac:  Visualized/normal in shape. Yolk sac: visualized Embryo: Visualized Cardiac Activity: Visualized Heart Rate: 157 bpm  CRL: 15  mm  7 w  6 d         [place] EDC: 01/25/2013  Maternal uterus/adnexae: No subchorionic hemorrhage or uterine fibroids identified.  A simple right ovarian cyst is seen measuring 3.7 cm.  Small amount of free fluid noted.  Left ovary is normal in appearance.  IMPRESSION:  1.  Single living IUP measuring 7 weeks 6 days, which is concordant with LMP. 2.  3.7 cm right ovarian corpus luteum cyst.  No adnexal mass identified.  Original Report Authenticated By: Danae Orleans, M.D.      Dillard Cannon [place], [place] 06/14/12 1744

## 2012-06-14 NOTE — ED Notes (Signed)
Received report and care of patient from Levonne Hubert, RN. Oriented to room.

## 2012-06-14 NOTE — ED Notes (Signed)
Patient eating and drinking without problem. Requested to go home.

## 2012-06-14 NOTE — ED Provider Notes (Addendum)
Medical screening examination/treatment/procedure(s) were performed by non-physician practitioner and as supervising physician I was immediately available for consultation/collaboration.  Cheri Guppy, MD 06/14/12 2003  Cheri Guppy, MD 06/14/12 2003

## 2012-06-15 LAB — GC/CHLAMYDIA PROBE AMP, GENITAL: GC Probe Amp, Genital: NEGATIVE

## 2012-06-16 NOTE — ED Provider Notes (Signed)
Medical screening examination/treatment/procedure(s) were performed by non-physician practitioner and as supervising physician I was immediately available for consultation/collaboration.  Korynn Kenedy, MD 06/16/12 1502 

## 2013-08-15 IMAGING — US US OB COMP LESS 14 WK
1 series · 14 of 28 positions shown · non-contrast
Comparison: None.

CLINICAL DATA: Right-sided pelvic pain. 7-week-2-day gestational
age by LMP.

OBSTETRIC <14 WK US AND TRANSVAGINAL OB US
TECHNIQUE: Both transabdominal and transvaginal ultrasound
examinations were performed for complete evaluation of the
gestation as well as the maternal uterus, adnexal regions, and
pelvic cul-de-sac.  Transvaginal technique was performed to assess
early pregnancy.

[Series 1: us ob comp less 14 wk · 0.25mm/px · 14 of 83 slices shown]
[im 4/83]
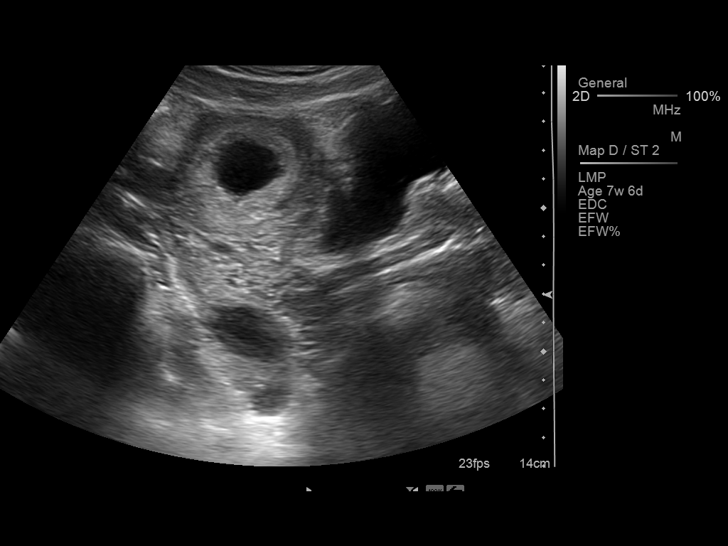
[im 10/83]
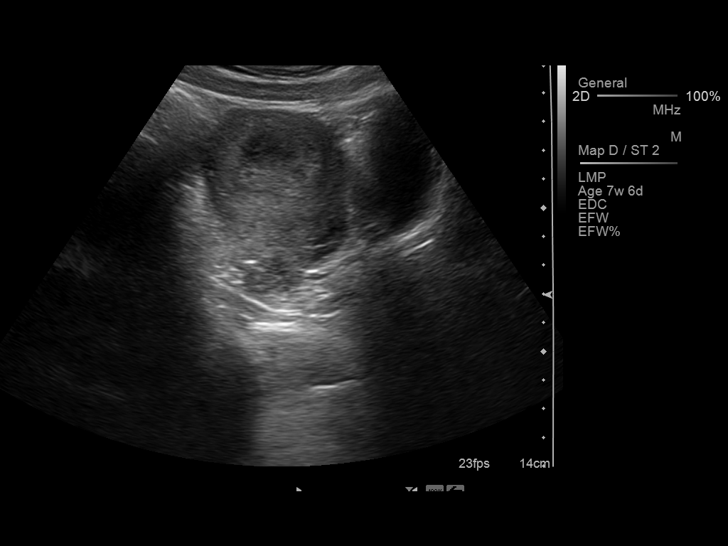
[im 16/83]
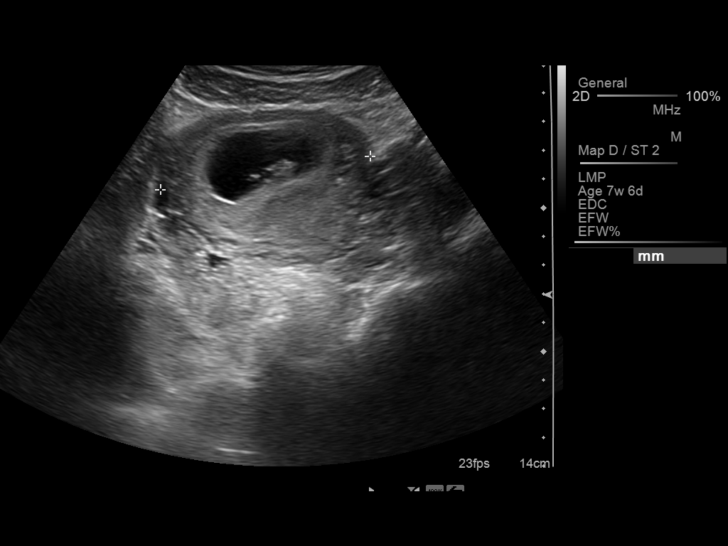
[im 22/83]
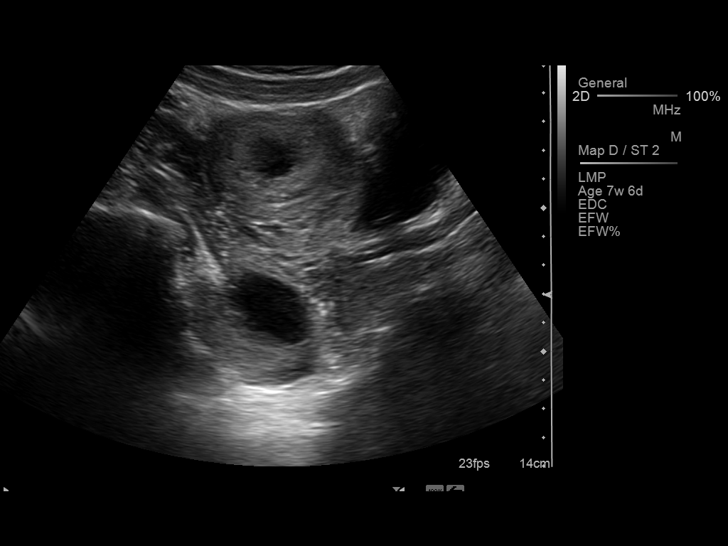
[im 28/83]
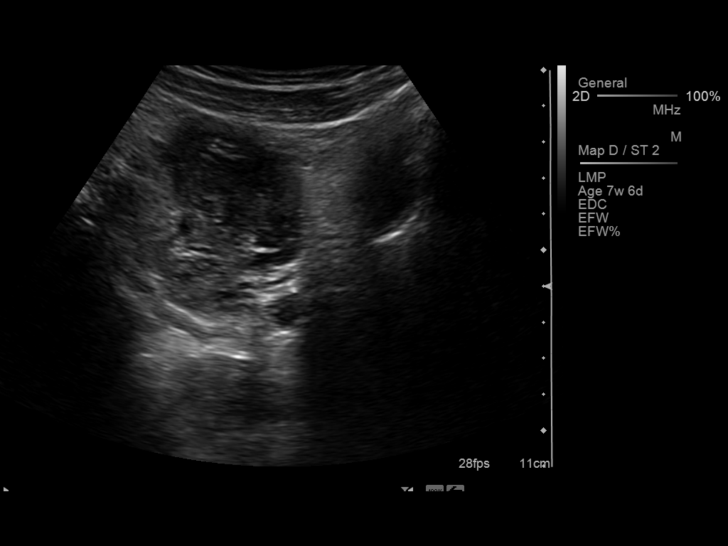
[im 34/83]
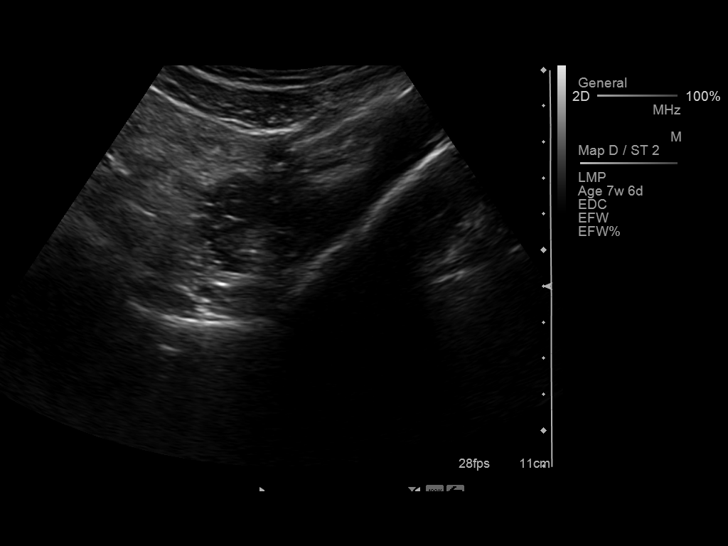
[im 40/83]
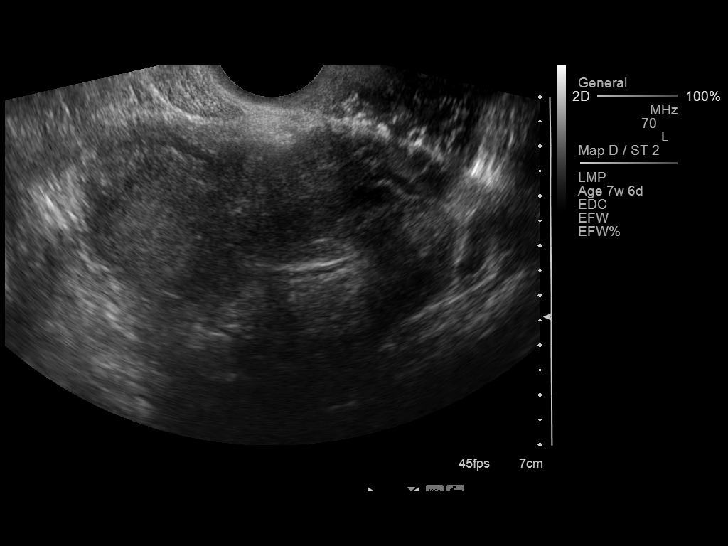
[im 46/83]
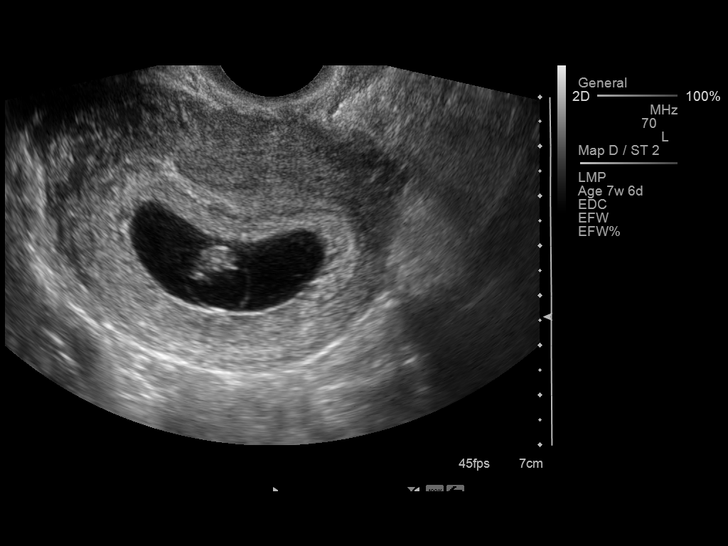
[im 52/83]
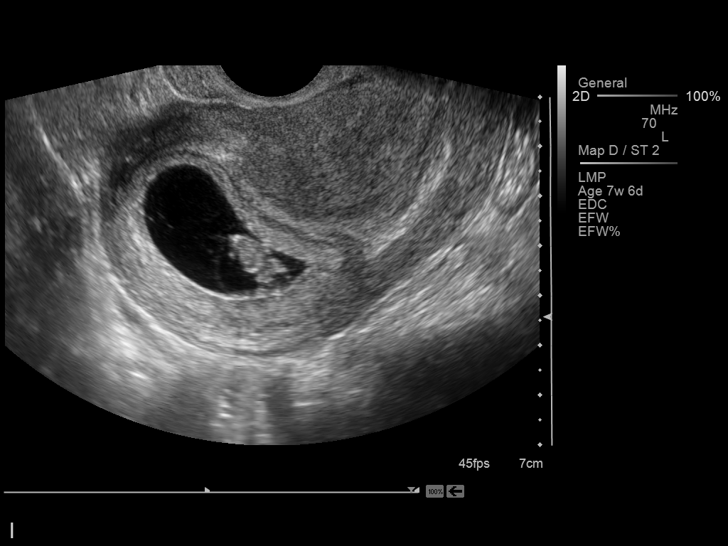
[im 58/83]
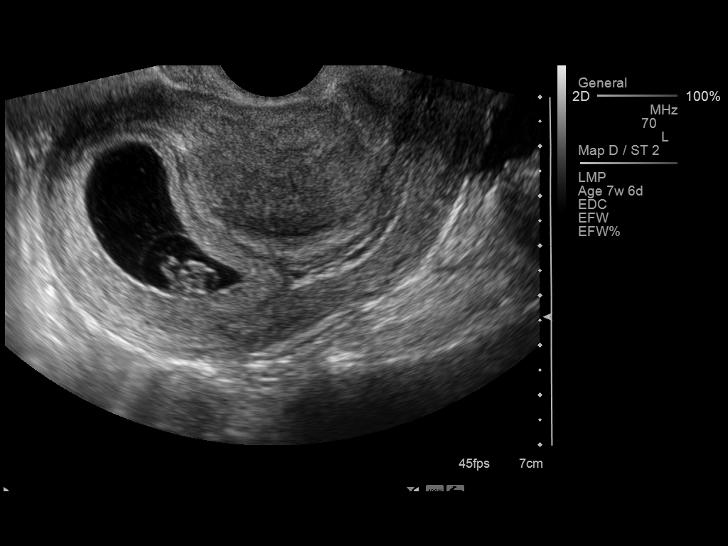
[im 64/83]
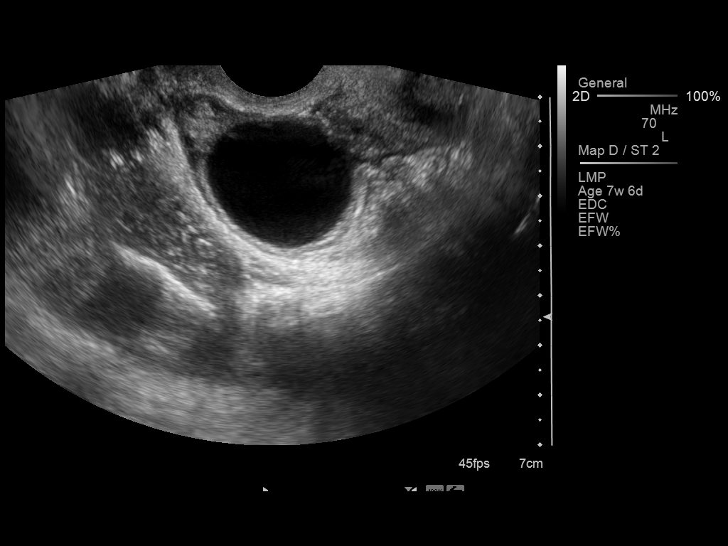
[im 70/83]
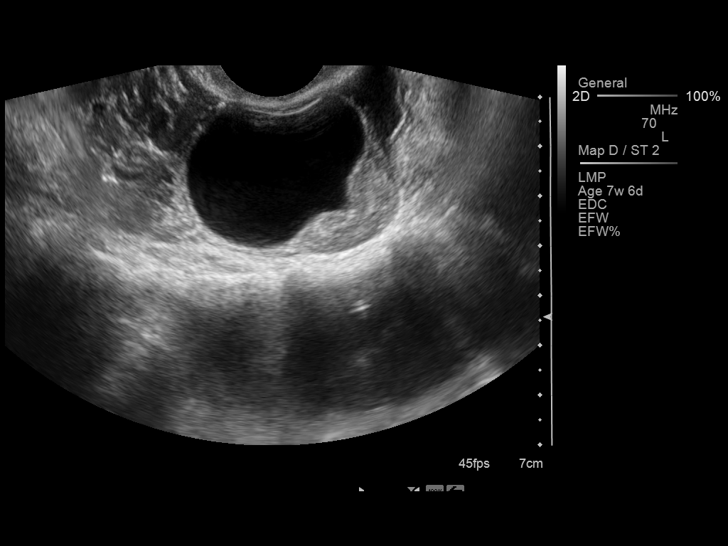
[im 76/83]
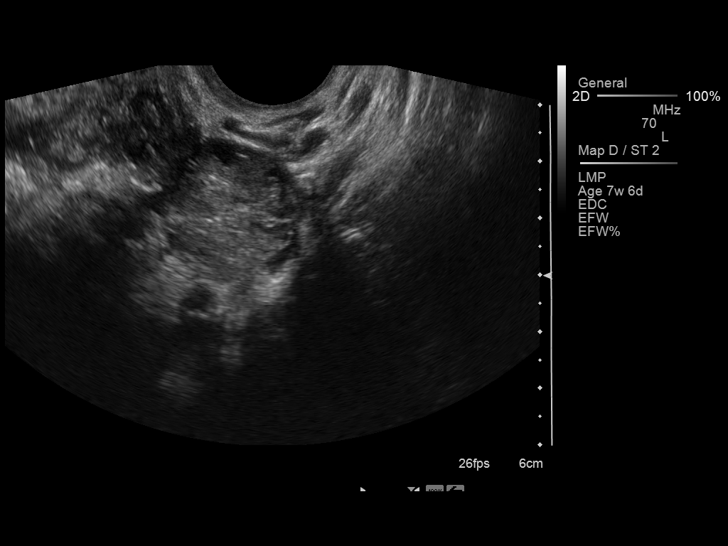
[im 83/83]
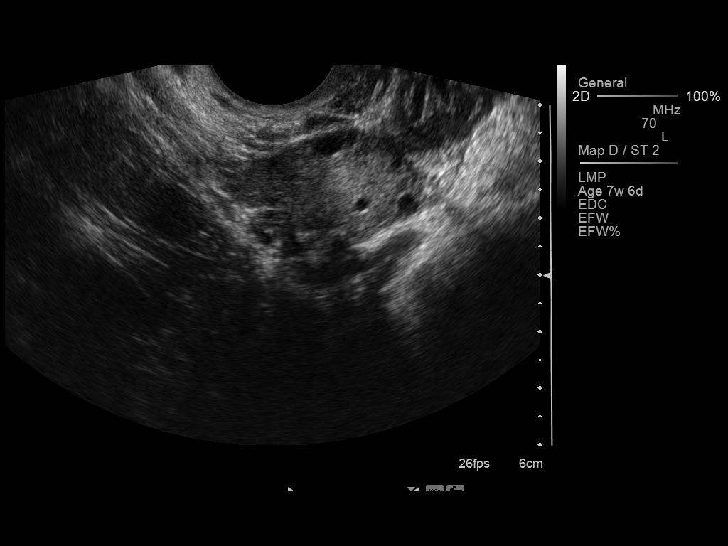

[14 of 28 positions shown; findings below may reference images not displayed]

Intrauterine gestational sac:  Visualized/normal in shape.
Yolk sac: visualized
Embryo: Visualized
Cardiac Activity: Visualized
Heart Rate: 157 bpm

CRL: 15  mm  7 w  6 d         US EDC: 01/25/2013

Maternal uterus/adnexae:
No subchorionic hemorrhage or uterine fibroids identified.  A
simple right ovarian cyst is seen measuring 3.7 cm.  Small amount
of free fluid noted.  Left ovary is normal in appearance.
IMPRESSION: 1.  Single living IUP measuring 7 weeks 6 days, which is concordant
with LMP.
[DATE] cm right ovarian corpus luteum cyst.  No adnexal mass
identified.

## 2015-04-30 LAB — OB RESULTS CONSOLE GC/CHLAMYDIA
CHLAMYDIA, DNA PROBE: NEGATIVE
GC PROBE AMP, GENITAL: NEGATIVE

## 2015-04-30 LAB — OB RESULTS CONSOLE RPR: RPR: NONREACTIVE

## 2015-04-30 LAB — OB RESULTS CONSOLE RUBELLA ANTIBODY, IGM: Rubella: IMMUNE

## 2015-04-30 LAB — OB RESULTS CONSOLE HEPATITIS B SURFACE ANTIGEN: Hepatitis B Surface Ag: NEGATIVE

## 2015-04-30 LAB — OB RESULTS CONSOLE HIV ANTIBODY (ROUTINE TESTING): HIV: NONREACTIVE

## 2015-07-28 ENCOUNTER — Encounter (HOSPITAL_COMMUNITY): Payer: Self-pay

## 2015-07-28 ENCOUNTER — Inpatient Hospital Stay (HOSPITAL_COMMUNITY)
Admission: AD | Admit: 2015-07-28 | Discharge: 2015-07-28 | Disposition: A | Discharge status: Home / Self Care | Payer: Medicaid Other | Source: Ambulatory Visit | Attending: Obstetrics & Gynecology | Admitting: Obstetrics & Gynecology

## 2015-07-28 DIAGNOSIS — N898 Other specified noninflammatory disorders of vagina: Secondary | ICD-10-CM

## 2015-07-28 DIAGNOSIS — O26892 Other specified pregnancy related conditions, second trimester: Secondary | ICD-10-CM | POA: Diagnosis not present

## 2015-07-28 DIAGNOSIS — R1031 Right lower quadrant pain: Secondary | ICD-10-CM | POA: Diagnosis present

## 2015-07-28 DIAGNOSIS — Z3A20 20 weeks gestation of pregnancy: Secondary | ICD-10-CM | POA: Diagnosis not present

## 2015-07-28 DIAGNOSIS — R141 Gas pain: Secondary | ICD-10-CM

## 2015-07-28 DIAGNOSIS — F17210 Nicotine dependence, cigarettes, uncomplicated: Secondary | ICD-10-CM | POA: Diagnosis not present

## 2015-07-28 DIAGNOSIS — O99332 Smoking (tobacco) complicating pregnancy, second trimester: Secondary | ICD-10-CM | POA: Diagnosis not present

## 2015-07-28 DIAGNOSIS — O26893 Other specified pregnancy related conditions, third trimester: Secondary | ICD-10-CM | POA: Insufficient documentation

## 2015-07-28 DIAGNOSIS — K5900 Constipation, unspecified: Secondary | ICD-10-CM | POA: Insufficient documentation

## 2015-07-28 DIAGNOSIS — K5909 Other constipation: Secondary | ICD-10-CM

## 2015-07-28 HISTORY — DX: Other specified health status: Z78.9

## 2015-07-28 LAB — CBC WITH DIFFERENTIAL/PLATELET
Basophils Absolute: 0 10*3/uL (ref 0.0–0.1)
Basophils Relative: 0 % (ref 0–1)
EOS PCT: 2 % (ref 0–5)
Eosinophils Absolute: 0.1 10*3/uL (ref 0.0–0.7)
HCT: 28.4 % — ABNORMAL LOW (ref 36.0–46.0)
Hemoglobin: 9.4 g/dL — ABNORMAL LOW (ref 12.0–15.0)
LYMPHS ABS: 1.7 10*3/uL (ref 0.7–4.0)
LYMPHS PCT: 24 % (ref 12–46)
MCH: 30.3 pg (ref 26.0–34.0)
MCHC: 33.1 g/dL (ref 30.0–36.0)
MCV: 91.6 fL (ref 78.0–100.0)
MONO ABS: 0.8 10*3/uL (ref 0.1–1.0)
Monocytes Relative: 10 % (ref 3–12)
Neutro Abs: 4.8 10*3/uL (ref 1.7–7.7)
Neutrophils Relative %: 64 % (ref 43–77)
PLATELETS: 200 10*3/uL (ref 150–400)
RBC: 3.1 MIL/uL — AB (ref 3.87–5.11)
RDW: 12.6 % (ref 11.5–15.5)
WBC: 7.4 10*3/uL (ref 4.0–10.5)

## 2015-07-28 LAB — URINALYSIS, ROUTINE W REFLEX MICROSCOPIC
BILIRUBIN URINE: NEGATIVE
Glucose, UA: NEGATIVE mg/dL
HGB URINE DIPSTICK: NEGATIVE
Ketones, ur: 15 mg/dL — AB
Leukocytes, UA: NEGATIVE
Nitrite: NEGATIVE
PROTEIN: NEGATIVE mg/dL
Specific Gravity, Urine: 1.025 (ref 1.005–1.030)
UROBILINOGEN UA: 1 mg/dL (ref 0.0–1.0)
pH: 6 (ref 5.0–8.0)

## 2015-07-28 LAB — WET PREP, GENITAL
Trich, Wet Prep: NONE SEEN
WBC, Wet Prep HPF POC: NONE SEEN
YEAST WET PREP: NONE SEEN

## 2015-07-28 MED ORDER — CALCIUM POLYCARBOPHIL 625 MG PO TABS: 625.0000 mg | ORAL_TABLET | Freq: Every day | ORAL | Status: DC

## 2015-07-28 MED ORDER — HYOSCYAMINE SULFATE 0.125 MG PO TABS
0.1250 mg | ORAL_TABLET | Freq: Once | ORAL | Status: DC
  Filled 2015-07-28: qty 1

## 2015-07-28 MED ORDER — HYOSCYAMINE SULFATE 0.125 MG PO TBDP
0.1250 mg | ORAL_TABLET | Freq: Once | ORAL | Status: AC
  Administered 2015-07-28: 0.125 mg via ORAL
  Filled 2015-07-28: qty 1

## 2015-07-28 NOTE — Discharge Instructions (Signed)

## 2015-07-28 NOTE — MAU Note (Signed)
Pt c/o lower abdominal pain that started today. States that she has not taken anything for pain, but has had about 2 glasses of water since 4pm. Denies vaginal bleeding. Pt gets P[facility] in [facility].

## 2015-07-28 NOTE — MAU Provider Note (Signed)
History     CSN: [id number]  Arrival date and time: 07/28/15 4098   First Provider Initiated Contact with Patient 07/28/15 2040      Chief Complaint  Patient presents with  . Abdominal Pain   This is a 29 y.o. female at [redacted]w[redacted]d who presents with c/o Right Lower quadrant pain since 2pm today. States it is crampy. States does not feel like contractions. Has never had pain like this before.Denies bleeding or leaking. Has long term constipation but did have a BM today. Usually goes a week between.  Hx is remarkable for Trich several months ago (states she and partner were treated).  Gets care in [place] with Dr Shawnie Pons, did not call him.   Abdominal Cramping This is a new problem. The current episode started today. The onset quality is undetermined. The problem occurs intermittently. The problem has been unchanged. The pain is located in the RLQ. The pain is moderate. The quality of the pain is colicky and cramping. The abdominal pain does not radiate. Associated symptoms include constipation (last BM today). Pertinent negatives include no anorexia, diarrhea, dysuria, fever, frequency, headaches, myalgias, nausea or vomiting. She has tried nothing for the symptoms.   RN Note: Pt c/o lower abdominal pain that started today. States that she has not taken anything for pain, but has had about 2 glasses of water since 4pm. Denies vaginal bleeding. Pt gets PNC in [facility].           OB History    Gravida Para Term Preterm AB TAB SAB Ectopic Multiple Living   6    2 2    3       Past Medical History  Diagnosis Date  . Medical history non-contributory     Past Surgical History  Procedure Laterality Date  . Hernia repair    . Dilation and curettage of uterus      History reviewed. No pertinent family history.  Social History  Substance Use Topics  . Smoking status: Current Every Day Smoker    Types: Cigarettes  . Smokeless tobacco: None     Comment: smokes 2 cigs/day  . Alcohol Use:  Yes     Comment: occasionally    Allergies: No Known Allergies  Prescriptions prior to admission  Medication Sig Dispense Refill Last Dose  . Prenatal Vit-Fe Fumarate-FA (PRENATAL COMPLETE) 14-0.4 MG TABS Take 1 tablet by mouth daily. (Patient taking differently: Take 2 tablets by mouth daily. ) 60 each 5 07/27/2015 at Unknown time    Review of Systems  Constitutional: Negative for fever, chills and malaise/fatigue.  Gastrointestinal: Positive for abdominal pain and constipation (last BM today). Negative for nausea, vomiting, diarrhea and anorexia.  Genitourinary: Negative for dysuria and frequency.  Musculoskeletal: Negative for myalgias.  Neurological: Negative for dizziness and headaches.   Physical Exam   Blood pressure 108/67, pulse 86, temperature 98.1 F (36.7 C), temperature source Oral, resp. rate 18, height 5' 4.5" (1.638 m), weight 68.13 kg (150 lb 3.2 oz), SpO2 100 %.  Physical Exam  Constitutional: She is oriented to person, place, and time. She appears well-developed and well-nourished. No distress.  HENT:  Head: Normocephalic.  Cardiovascular: Normal rate and regular rhythm.   Respiratory: Effort normal. No respiratory distress.  GI: Soft. Bowel sounds are normal. She exhibits no distension and no mass. There is tenderness (mild tenderness to RLQ). There is no rebound and no guarding.  Genitourinary: Vaginal discharge (white creamy) found.  Dilation: Closed Effacement (%): Thick Station:  (  high) Exam by:: Wynelle Bourgeois   Musculoskeletal: Normal range of motion.  Neurological: She is alert and oriented to person, place, and time.  Skin: Skin is warm and dry.  Psychiatric: She has a normal mood and affect.  Fetal heart rate 145 by doppler   MAU Course  Procedures  MDM GC/Chlamydia and wet prep collected due to recent hx Trich PO hydration Will check CBC to help rule out appy Does not appear ill or toxic, no signs of acute abdomen Will try a dose of  Levsin  >>  Got very good relief from this. Passed some gas and pain is almost gone  Results for orders placed or performed during the hospital encounter of 07/28/15 (from the past 24 hour(s))  Urinalysis, Routine w reflex microscopic (not at [facility])     Status: Abnormal   Collection Time: 07/28/15  7:20 PM  Result Value Ref Range   Color, Urine YELLOW YELLOW   APPearance HAZY (A) CLEAR   Specific Gravity, Urine 1.025 1.005 - 1.030   pH 6.0 5.0 - 8.0   Glucose, UA NEGATIVE NEGATIVE mg/dL   Hgb urine dipstick NEGATIVE NEGATIVE   Bilirubin Urine NEGATIVE NEGATIVE   Ketones, ur 15 (A) NEGATIVE mg/dL   Protein, ur NEGATIVE NEGATIVE mg/dL   Urobilinogen, UA 1.0 0.0 - 1.0 mg/dL   Nitrite NEGATIVE NEGATIVE   Leukocytes, UA NEGATIVE NEGATIVE  Wet prep, genital     Status: Abnormal   Collection Time: 07/28/15  8:46 PM  Result Value Ref Range   Yeast Wet Prep HPF POC NONE SEEN NONE SEEN   Trich, Wet Prep NONE SEEN NONE SEEN   Clue Cells Wet Prep HPF POC FEW (A) NONE SEEN   WBC, Wet Prep HPF POC NONE SEEN NONE SEEN  CBC with Differential     Status: Abnormal   Collection Time: 07/28/15  9:11 PM  Result Value Ref Range   WBC 7.4 4.0 - 10.5 K/uL   RBC 3.10 (L) 3.87 - 5.11 MIL/uL   Hemoglobin 9.4 (L) 12.0 - 15.0 g/dL   HCT 16.1 (L) 09.6 - 04.5 %   MCV 91.6 78.0 - 100.0 fL   MCH 30.3 26.0 - 34.0 pg   MCHC 33.1 30.0 - 36.0 g/dL   RDW 40.9 81.1 - 91.4 %   Platelets 200 150 - 400 K/uL   Neutrophils Relative % 64 43 - 77 %   Neutro Abs 4.8 1.7 - 7.7 K/uL   Lymphocytes Relative 24 12 - 46 %   Lymphs Abs 1.7 0.7 - 4.0 K/uL   Monocytes Relative 10 3 - 12 %   Monocytes Absolute 0.8 0.1 - 1.0 K/uL   Eosinophils Relative 2 0 - 5 %   Eosinophils Absolute 0.1 0.0 - 0.7 K/uL   Basophils Relative 0 0 - 1 %   Basophils Absolute 0.0 0.0 - 0.1 K/uL     Assessment and Plan  A:  SIUP at [redacted]w[redacted]d        RLQ abdominal pain       Chronic constipation       Probable abdominal gas as source of pain,  relieved by Levsin       Vaginal discharge, physiologic  P:  Discussed results       Normal WBC count, doubt appendicitis       No evidence of preterm contractions       Reviewed constipation and suggest nightly dose of fiber        Push  PO fluids        Call Dr Shawnie Pons tomorrow to set up appt for follow up exam          [facility] 07/28/2015, 8:41 PM

## 2015-07-29 LAB — GC/CHLAMYDIA PROBE AMP (~~LOC~~) NOT AT ARMC
CHLAMYDIA, DNA PROBE: NEGATIVE
NEISSERIA GONORRHEA: NEGATIVE

## 2015-11-18 LAB — OB RESULTS CONSOLE GBS: STREP GROUP B AG: NEGATIVE

## 2015-11-23 NOTE — L&D Delivery Note (Signed)
Delivery Note At 4:56 PM a viable female was delivered via Vaginal, Spontaneous Delivery (Presentation:vertex ; Occiput Anterior).  APGAR: 8, 9; weight  .   Placenta status: Intact, Spontaneous.  Cord: 3 vessels with the following complications: None.  Cord pH: n/a  Anesthesia: None  Episiotomy: None Lacerations: None Suture Repair: n/a Est. Blood Loss (mL): 150  Mom to postpartum.  Baby to Couplet care / Skin to Skin.  Melissa Henderson DARLENE 12/14/2015, 5:19 PM

## 2015-12-13 ENCOUNTER — Encounter (HOSPITAL_COMMUNITY): Payer: Self-pay

## 2015-12-13 ENCOUNTER — Inpatient Hospital Stay (HOSPITAL_COMMUNITY): Payer: Medicaid Other

## 2015-12-13 ENCOUNTER — Inpatient Hospital Stay (HOSPITAL_COMMUNITY)
Admission: AD | Admit: 2015-12-13 | Discharge: 2015-12-13 | Disposition: A | Discharge status: Home / Self Care | Payer: Medicaid Other | Source: Ambulatory Visit | Attending: Obstetrics and Gynecology | Admitting: Obstetrics and Gynecology

## 2015-12-13 DIAGNOSIS — O368390 Maternal care for abnormalities of the fetal heart rate or rhythm, unspecified trimester, not applicable or unspecified: Secondary | ICD-10-CM

## 2015-12-13 MED ORDER — OXYCODONE-ACETAMINOPHEN 5-325 MG PO TABS
1.0000 | ORAL_TABLET | Freq: Once | ORAL | Status: AC
  Administered 2015-12-13: 1 via ORAL
  Filled 2015-12-13: qty 1

## 2015-12-13 MED ORDER — ZOLPIDEM TARTRATE 5 MG PO TABS
5.0000 mg | ORAL_TABLET | Freq: Once | ORAL | Status: AC
  Administered 2015-12-13: 5 mg via ORAL
  Filled 2015-12-13: qty 1

## 2015-12-13 NOTE — Discharge Instructions (Signed)
Third Trimester of Pregnancy °The third trimester is from week 29 through week 42, months 7 through 9. The third trimester is a time when the fetus is growing rapidly. At the end of the ninth month, the fetus is about 20 inches in length and weighs 6-10 pounds.  °BODY CHANGES °Your body goes through many changes during pregnancy. The changes vary from woman to woman.  °· Your weight will continue to increase. You can expect to gain 25-35 pounds (11-16 kg) by the end of the pregnancy. °· You may begin to get stretch marks on your hips, abdomen, and breasts. °· You may urinate more often because the fetus is moving lower into your pelvis and pressing on your bladder. °· You may develop or continue to have heartburn as a result of your pregnancy. °· You may develop constipation because certain hormones are causing the muscles that push waste through your intestines to slow down. °· You may develop hemorrhoids or swollen, bulging veins (varicose veins). °· You may have pelvic pain because of the weight gain and pregnancy hormones relaxing your joints between the bones in your pelvis. Backaches may result from overexertion of the muscles supporting your posture. °· You may have changes in your hair. These can include thickening of your hair, rapid growth, and changes in texture. Some women also have hair loss during or after pregnancy, or hair that feels dry or thin. Your hair will most likely return to normal after your baby is born. °· Your breasts will continue to grow and be tender. A yellow discharge may leak from your breasts called colostrum. °· Your belly button may stick out. °· You may feel short of breath because of your expanding uterus. °· You may notice the fetus "dropping," or moving lower in your abdomen. °· You may have a bloody mucus discharge. This usually occurs a few days to a week before labor begins. °· Your cervix becomes thin and soft (effaced) near your due date. °WHAT TO EXPECT AT YOUR PRENATAL  EXAMS  °You will have prenatal exams every 2 weeks until week 36. Then, you will have weekly prenatal exams. During a routine prenatal visit: °· You will be weighed to make sure you and the fetus are growing normally. °· Your blood pressure is taken. °· Your abdomen will be measured to track your baby's growth. °· The fetal heartbeat will be listened to. °· Any test results from the previous visit will be discussed. °· You may have a cervical check near your due date to see if you have effaced. °At around 36 weeks, your caregiver will check your cervix. At the same time, your caregiver will also perform a test on the secretions of the vaginal tissue. This test is to determine if a type of bacteria, Group B streptococcus, is present. Your caregiver will explain this further. °Your caregiver may ask you: °· What your birth plan is. °· How you are feeling. °· If you are feeling the baby move. °· If you have had any abnormal symptoms, such as leaking fluid, bleeding, severe headaches, or abdominal cramping. °· If you are using any tobacco products, including cigarettes, chewing tobacco, and electronic cigarettes. °· If you have any questions. °Other tests or screenings that may be performed during your third trimester include: °· Blood tests that check for low iron levels (anemia). °· Fetal testing to check the health, activity level, and growth of the fetus. Testing is done if you have certain medical conditions or if   there are problems during the pregnancy. °· HIV (human immunodeficiency virus) testing. If you are at high risk, you may be screened for HIV during your third trimester of pregnancy. °FALSE LABOR °You may feel small, irregular contractions that eventually go away. These are called Braxton Hicks contractions, or false labor. Contractions may last for hours, days, or even weeks before true labor sets in. If contractions come at regular intervals, intensify, or become painful, it is best to be seen by your  caregiver.  °SIGNS OF LABOR  °· Menstrual-like cramps. °· Contractions that are 5 minutes apart or less. °· Contractions that start on the top of the uterus and spread down to the lower abdomen and back. °· A sense of increased pelvic pressure or back pain. °· A watery or bloody mucus discharge that comes from the vagina. °If you have any of these signs before the 37th week of pregnancy, call your caregiver right away. You need to go to the hospital to get checked immediately. °HOME CARE INSTRUCTIONS  °· Avoid all smoking, herbs, alcohol, and unprescribed drugs. These chemicals affect the formation and growth of the baby. °· Do not use any tobacco products, including cigarettes, chewing tobacco, and electronic cigarettes. If you need help quitting, ask your health care provider. You may receive counseling support and other resources to help you quit. °· Follow your caregiver's instructions regarding medicine use. There are medicines that are either safe or unsafe to take during pregnancy. °· Exercise only as directed by your caregiver. Experiencing uterine cramps is a good sign to stop exercising. °· Continue to eat regular, healthy meals. °· Wear a good support bra for breast tenderness. °· Do not use hot tubs, steam rooms, or saunas. °· Wear your seat belt at all times when driving. °· Avoid raw meat, uncooked cheese, cat litter boxes, and soil used by cats. These carry germs that can cause birth defects in the baby. °· Take your prenatal vitamins. °· Take 1500-2000 mg of calcium daily starting at the 20th week of pregnancy until you deliver your baby. °· Try taking a stool softener (if your caregiver approves) if you develop constipation. Eat more high-fiber foods, such as fresh vegetables or fruit and whole grains. Drink plenty of fluids to keep your urine clear or pale yellow. °· Take warm sitz baths to soothe any pain or discomfort caused by hemorrhoids. Use hemorrhoid cream if your caregiver approves. °· If  you develop varicose veins, wear support hose. Elevate your feet for 15 minutes, 3-4 times a day. Limit salt in your diet. °· Avoid heavy lifting, wear low heal shoes, and practice good posture. °· Rest a lot with your legs elevated if you have leg cramps or low back pain. °· Visit your dentist if you have not gone during your pregnancy. Use a soft toothbrush to brush your teeth and be gentle when you floss. °· A sexual relationship may be continued unless your caregiver directs you otherwise. °· Do not travel far distances unless it is absolutely necessary and only with the approval of your caregiver. °· Take prenatal classes to understand, practice, and ask questions about the labor and delivery. °· Make a trial run to the hospital. °· Pack your hospital bag. °· Prepare the baby's nursery. °· Continue to go to all your prenatal visits as directed by your caregiver. °SEEK MEDICAL CARE IF: °· You are unsure if you are in labor or if your water has broken. °· You have dizziness. °· You have   mild pelvic cramps, pelvic pressure, or nagging pain in your abdominal area. °· You have persistent nausea, vomiting, or diarrhea. °· You have a bad smelling vaginal discharge. °· You have pain with urination. °SEEK IMMEDIATE MEDICAL CARE IF:  °· You have a fever. °· You are leaking fluid from your vagina. °· You have spotting or bleeding from your vagina. °· You have severe abdominal cramping or pain. °· You have rapid weight loss or gain. °· You have shortness of breath with chest pain. °· You notice sudden or extreme swelling of your face, hands, ankles, feet, or legs. °· You have not felt your baby move in over an hour. °· You have severe headaches that do not go away with medicine. °· You have vision changes. °  °This information is not intended to replace advice given to you by your health care provider. Make sure you discuss any questions you have with your health care provider. °  °Document Released: 11/02/2001 Document  Revised: 11/29/2014 Document Reviewed: 01/09/2013 °[employer]vier Interactive Patient Education ©2016 [employer] °Fetal Movement Counts °Patient Name: __________________________________________________ Patient Due Date: ____________________ °Performing a fetal movement count is highly recommended in high-risk pregnancies, but it is good for every pregnant woman to do. Your health care provider may ask you to start counting fetal movements at 28 weeks of the pregnancy. Fetal movements often increase: °· After eating a full meal. °· After physical activity. °· After eating or drinking something sweet or cold. °· At rest. °Pay attention to when you feel the baby is most active. This will help you notice a pattern of your baby's sleep and wake cycles and what factors contribute to an increase in fetal movement. It is important to perform a fetal movement count at the same time each day when your baby is normally most active.  °HOW TO COUNT FETAL MOVEMENTS °· Find a quiet and comfortable area to sit or lie down on your left side. Lying on your left side provides the best blood and oxygen circulation to your baby. °· Write down the day and time on a sheet of paper or in a journal. °· Start counting kicks, flutters, swishes, rolls, or jabs in a 2-hour period. You should feel at least 10 movements within 2 hours. °· If you do not feel 10 movements in 2 hours, wait 2-3 hours and count again. Look for a change in the pattern or not enough counts in 2 hours. °SEEK MEDICAL CARE IF: °· You feel less than 10 counts in 2 hours, tried twice. °· There is no movement in over an hour. °· The pattern is changing or taking longer each day to reach 10 counts in 2 hours. °· You feel the baby is not moving as he or she usually does. °Date: ____________ Movements: ____________ Start time: ____________ Finish time: ____________  °Date: ____________ Movements: ____________ Start time: ____________ Finish time: ____________ °Date: ____________  Movements: ____________ Start time: ____________ Finish time: ____________ °Date: ____________ Movements: ____________ Start time: ____________ Finish time: ____________ °Date: ____________ Movements: ____________ Start time: ____________ Finish time: ____________ °Date: ____________ Movements: ____________ Start time: ____________ Finish time: ____________ °Date: ____________ Movements: ____________ Start time: ____________ Finish time: ____________ °Date: ____________ Movements: ____________ Start time: ____________ Finish time: ____________  °Date: ____________ Movements: ____________ Start time: ____________ Finish time: ____________ °Date: ____________ Movements: ____________ Start time: ____________ Finish time: ____________ °Date: ____________ Movements: ____________ Start time: ____________ Finish time: ____________ °Date: ____________ Movements: ____________ Start time: ____________ Finish time: ____________ °Date:   ____________ Movements: ____________ Start time: ____________ Finish time: ____________ °Date: ____________ Movements: ____________ Start time: ____________ Finish time: ____________ °Date: ____________ Movements: ____________ Start time: ____________ Finish time: ____________  °Date: ____________ Movements: ____________ Start time: ____________ Finish time: ____________ °Date: ____________ Movements: ____________ Start time: ____________ Finish time: ____________ °Date: ____________ Movements: ____________ Start time: ____________ Finish time: ____________ °Date: ____________ Movements: ____________ Start time: ____________ Finish time: ____________ °Date: ____________ Movements: ____________ Start time: ____________ Finish time: ____________ °Date: ____________ Movements: ____________ Start time: ____________ Finish time: ____________ °Date: ____________ Movements: ____________ Start time: ____________ Finish time: ____________  °Date: ____________ Movements: ____________ Start time:  ____________ Finish time: ____________ °Date: ____________ Movements: ____________ Start time: ____________ Finish time: ____________ °Date: ____________ Movements: ____________ Start time: ____________ Finish time: ____________ °Date: ____________ Movements: ____________ Start time: ____________ Finish time: ____________ °Date: ____________ Movements: ____________ Start time: ____________ Finish time: ____________ °Date: ____________ Movements: ____________ Start time: ____________ Finish time: ____________ °Date: ____________ Movements: ____________ Start time: ____________ Finish time: ____________  °Date: ____________ Movements: ____________ Start time: ____________ Finish time: ____________ °Date: ____________ Movements: ____________ Start time: ____________ Finish time: ____________ °Date: ____________ Movements: ____________ Start time: ____________ Finish time: ____________ °Date: ____________ Movements: ____________ Start time: ____________ Finish time: ____________ °Date: ____________ Movements: ____________ Start time: ____________ Finish time: ____________ °Date: ____________ Movements: ____________ Start time: ____________ Finish time: ____________ °Date: ____________ Movements: ____________ Start time: ____________ Finish time: ____________  °Date: ____________ Movements: ____________ Start time: ____________ Finish time: ____________ °Date: ____________ Movements: ____________ Start time: ____________ Finish time: ____________ °Date: ____________ Movements: ____________ Start time: ____________ Finish time: ____________ °Date: ____________ Movements: ____________ Start time: ____________ Finish time: ____________ °Date: ____________ Movements: ____________ Start time: ____________ Finish time: ____________ °Date: ____________ Movements: ____________ Start time: ____________ Finish time: ____________ °Date: ____________ Movements: ____________ Start time: ____________ Finish time: ____________  °Date:  ____________ Movements: ____________ Start time: ____________ Finish time: ____________ °Date: ____________ Movements: ____________ Start time: ____________ Finish time: ____________ °Date: ____________ Movements: ____________ Start time: ____________ Finish time: ____________ °Date: ____________ Movements: ____________ Start time: ____________ Finish time: ____________ °Date: ____________ Movements: ____________ Start time: ____________ Finish time: ____________ °Date: ____________ Movements: ____________ Start time: ____________ Finish time: ____________ °Date: ____________ Movements: ____________ Start time: ____________ Finish time: ____________  °Date: ____________ Movements: ____________ Start time: ____________ Finish time: ____________ °Date: ____________ Movements: ____________ Start time: ____________ Finish time: ____________ °Date: ____________ Movements: ____________ Start time: ____________ Finish time: ____________ °Date: ____________ Movements: ____________ Start time: ____________ Finish time: ____________ °Date: ____________ Movements: ____________ Start time: ____________ Finish time: ____________ °Date: ____________ Movements: ____________ Start time: ____________ Finish time: ____________ °  °This information is not intended to replace advice given to you by your health care provider. Make sure you discuss any questions you have with your health care provider. °  °Document Released: 12/08/2006 Document Revised: 11/29/2014 Document Reviewed: 09/04/2012 °[employer]vier Interactive Patient Education ©2016 [employer] °Braxton Hicks Contractions °Contractions of the uterus can occur throughout pregnancy. Contractions are not always a sign that you are in labor.  °WHAT ARE BRAXTON HICKS CONTRACTIONS?  °Contractions that occur before labor are called Braxton Hicks contractions, or false labor. Toward the end of pregnancy (32-34 weeks), these contractions can develop more often and may become more  forceful. This is not true labor because these contractions do not result in opening (dilatation) and thinning of the cervix. They are sometimes difficult to tell apart from true labor because these contractions can be forceful and people have different pain tolerances. You should   not feel embarrassed if you go to the hospital with false labor. Sometimes, the only way to tell if you are in true labor is for your health care provider to look for changes in the cervix. °If there are no prenatal problems or other health problems associated with the pregnancy, it is completely safe to be sent home with false labor and await the onset of true labor. °HOW CAN YOU TELL THE DIFFERENCE BETWEEN TRUE AND FALSE LABOR? °False Labor °· The contractions of false labor are usually shorter and not as hard as those of true labor.   °· The contractions are usually irregular.   °· The contractions are often felt in the front of the lower abdomen and in the groin.   °· The contractions may go away when you walk around or change positions while lying down.   °· The contractions get weaker and are shorter lasting as time goes on.   °· The contractions do not usually become progressively stronger, regular, and closer together as with true labor.   °True Labor °· Contractions in true labor last 30-70 seconds, become very regular, usually become more intense, and increase in frequency.   °· The contractions do not go away with walking.   °· The discomfort is usually felt in the top of the uterus and spreads to the lower abdomen and low back.   °· True labor can be determined by your health care provider with an exam. This will show that the cervix is dilating and getting thinner.   °WHAT TO REMEMBER °· Keep up with your usual exercises and follow other instructions given by your health care provider.   °· Take medicines as directed by your health care provider.   °· Keep your regular prenatal appointments.   °· Eat and drink lightly if you  think you are going into labor.   °· If Braxton Hicks contractions are making you uncomfortable:   °· Change your position from lying down or resting to walking, or from walking to resting.   °· Sit and rest in a tub of warm water.   °· Drink 2-3 glasses of water. Dehydration may cause these contractions.   °· Do slow and deep breathing several times an hour.   °WHEN SHOULD I SEEK IMMEDIATE MEDICAL CARE? °Seek immediate medical care if: °· Your contractions become stronger, more regular, and closer together.   °· You have fluid leaking or gushing from your vagina.   °· You have a fever.   °· You pass blood-tinged mucus.   °· You have vaginal bleeding.   °· You have continuous abdominal pain.   °· You have low back pain that you never had before.   °· You feel your baby's head pushing down and causing pelvic pressure.   °· Your baby is not moving as much as it used to.   °  °This information is not intended to replace advice given to you by your health care provider. Make sure you discuss any questions you have with your health care provider. °  °Document Released: 11/08/2005 Document Revised: 11/13/2013 Document Reviewed: 08/20/2013 °Elsevier Interactive Patient Education ©2016 [employer] ° °

## 2015-12-13 NOTE — Progress Notes (Signed)
Notified of [facility]HR deceleration. On the way to see pt

## 2015-12-13 NOTE — MAU Note (Signed)
Pt presents complaining of contractions. Denies bleeding or leaking. Reports good fetal movement

## 2015-12-13 NOTE — Progress Notes (Signed)
Notified of pt recheck and unchanged cervix. Will get BPP and recheck cervix after

## 2015-12-14 ENCOUNTER — Encounter (HOSPITAL_COMMUNITY): Payer: Self-pay | Admitting: *Deleted

## 2015-12-14 ENCOUNTER — Inpatient Hospital Stay (HOSPITAL_COMMUNITY)
Admission: AD | Admit: 2015-12-14 | Discharge: 2015-12-15 | DRG: 775 | Disposition: A | Discharge status: Home / Self Care | Payer: Medicaid Other | Source: Ambulatory Visit | Attending: Obstetrics & Gynecology | Admitting: Obstetrics & Gynecology

## 2015-12-14 DIAGNOSIS — Z823 Family history of stroke: Secondary | ICD-10-CM | POA: Diagnosis not present

## 2015-12-14 DIAGNOSIS — O480 Post-term pregnancy: Secondary | ICD-10-CM | POA: Diagnosis present

## 2015-12-14 DIAGNOSIS — Z833 Family history of diabetes mellitus: Secondary | ICD-10-CM

## 2015-12-14 DIAGNOSIS — Z3A40 40 weeks gestation of pregnancy: Secondary | ICD-10-CM | POA: Diagnosis not present

## 2015-12-14 DIAGNOSIS — Z87891 Personal history of nicotine dependence: Secondary | ICD-10-CM

## 2015-12-14 DIAGNOSIS — Z8249 Family history of ischemic heart disease and other diseases of the circulatory system: Secondary | ICD-10-CM | POA: Diagnosis not present

## 2015-12-14 LAB — TYPE AND SCREEN
ABO/RH(D): O POS
Antibody Screen: NEGATIVE

## 2015-12-14 LAB — CBC
HEMATOCRIT: 33.2 % — AB (ref 36.0–46.0)
Hemoglobin: 11.1 g/dL — ABNORMAL LOW (ref 12.0–15.0)
MCH: 29.8 pg (ref 26.0–34.0)
MCHC: 33.4 g/dL (ref 30.0–36.0)
MCV: 89.2 fL (ref 78.0–100.0)
PLATELETS: 154 10*3/uL (ref 150–400)
RBC: 3.72 MIL/uL — ABNORMAL LOW (ref 3.87–5.11)
RDW: 13.2 % (ref 11.5–15.5)
WBC: 16.2 10*3/uL — ABNORMAL HIGH (ref 4.0–10.5)

## 2015-12-14 LAB — ABO/RH: ABO/RH(D): O POS

## 2015-12-14 MED ORDER — OXYCODONE-ACETAMINOPHEN 5-325 MG PO TABS
1.0000 | ORAL_TABLET | ORAL | Status: DC | PRN
  Administered 2015-12-14 – 2015-12-15 (×3): 1 via ORAL
  Filled 2015-12-14 (×4): qty 1

## 2015-12-14 MED ORDER — ACETAMINOPHEN 325 MG PO TABS: 650.0000 mg | ORAL_TABLET | ORAL | Status: DC | PRN

## 2015-12-14 MED ORDER — DIBUCAINE 1 % RE OINT: 1.0000 "application " | TOPICAL_OINTMENT | RECTAL | Status: DC | PRN

## 2015-12-14 MED ORDER — OXYTOCIN 10 UNIT/ML IJ SOLN: 10.0000 [IU] | Freq: Once | INTRAMUSCULAR | Status: DC

## 2015-12-14 MED ORDER — PRENATAL MULTIVITAMIN CH
1.0000 | ORAL_TABLET | Freq: Every day | ORAL | Status: DC
  Administered 2015-12-15: 1 via ORAL
  Filled 2015-12-14: qty 1

## 2015-12-14 MED ORDER — ZOLPIDEM TARTRATE 5 MG PO TABS: 5.0000 mg | ORAL_TABLET | Freq: Every evening | ORAL | Status: DC | PRN

## 2015-12-14 MED ORDER — TETANUS-DIPHTH-ACELL PERTUSSIS 5-2.5-18.5 LF-MCG/0.5 IM SUSP: 0.5000 mL | Freq: Once | INTRAMUSCULAR | Status: DC

## 2015-12-14 MED ORDER — ONDANSETRON HCL 4 MG/2ML IJ SOLN: 4.0000 mg | INTRAMUSCULAR | Status: DC | PRN

## 2015-12-14 MED ORDER — SODIUM CHLORIDE 0.9 % IV SOLN: 250.0000 mL | INTRAVENOUS | Status: DC | PRN

## 2015-12-14 MED ORDER — LIDOCAINE HCL (PF) 1 % IJ SOLN
INTRAMUSCULAR | Status: AC
  Filled 2015-12-14: qty 30

## 2015-12-14 MED ORDER — SODIUM CHLORIDE 0.9 % IJ SOLN: 3.0000 mL | INTRAMUSCULAR | Status: DC | PRN

## 2015-12-14 MED ORDER — SIMETHICONE 80 MG PO CHEW: 80.0000 mg | CHEWABLE_TABLET | ORAL | Status: DC | PRN

## 2015-12-14 MED ORDER — LANOLIN HYDROUS EX OINT: TOPICAL_OINTMENT | CUTANEOUS | Status: DC | PRN

## 2015-12-14 MED ORDER — OXYCODONE-ACETAMINOPHEN 5-325 MG PO TABS: 2.0000 | ORAL_TABLET | ORAL | Status: DC | PRN

## 2015-12-14 MED ORDER — DIPHENHYDRAMINE HCL 25 MG PO CAPS: 25.0000 mg | ORAL_CAPSULE | Freq: Four times a day (QID) | ORAL | Status: DC | PRN

## 2015-12-14 MED ORDER — SODIUM CHLORIDE 0.9 % IJ SOLN: 3.0000 mL | Freq: Two times a day (BID) | INTRAMUSCULAR | Status: DC

## 2015-12-14 MED ORDER — OXYTOCIN 10 UNIT/ML IJ SOLN
INTRAMUSCULAR | Status: AC
  Filled 2015-12-14: qty 1

## 2015-12-14 MED ORDER — ONDANSETRON HCL 4 MG PO TABS: 4.0000 mg | ORAL_TABLET | ORAL | Status: DC | PRN

## 2015-12-14 MED ORDER — MEASLES, MUMPS & RUBELLA VAC ~~LOC~~ INJ: 0.5000 mL | INJECTION | Freq: Once | SUBCUTANEOUS | Status: DC

## 2015-12-14 MED ORDER — IBUPROFEN 600 MG PO TABS
600.0000 mg | ORAL_TABLET | Freq: Four times a day (QID) | ORAL | Status: DC
  Administered 2015-12-14 – 2015-12-15 (×5): 600 mg via ORAL
  Filled 2015-12-14 (×4): qty 1

## 2015-12-14 MED ORDER — BENZOCAINE-MENTHOL 20-0.5 % EX AERO: 1.0000 "application " | INHALATION_SPRAY | CUTANEOUS | Status: DC | PRN

## 2015-12-14 MED ORDER — SENNOSIDES-DOCUSATE SODIUM 8.6-50 MG PO TABS
2.0000 | ORAL_TABLET | ORAL | Status: DC
  Administered 2015-12-14: 2 via ORAL
  Filled 2015-12-14: qty 2

## 2015-12-14 MED ORDER — WITCH HAZEL-GLYCERIN EX PADS: 1.0000 "application " | MEDICATED_PAD | CUTANEOUS | Status: DC | PRN

## 2015-12-14 MED ORDER — OXYTOCIN 10 UNIT/ML IJ SOLN
INTRAVENOUS | Status: AC
  Filled 2015-12-14: qty 4

## 2015-12-14 NOTE — MAU Note (Signed)
Pt came in EMS, C/O frequent uc's, states she was sent home from [facility] today.

## 2015-12-14 NOTE — MAU Note (Signed)
Called Zerita Boers CNM patient care in [place], [place] term 9 to 9.5 cm going to room 165.

## 2015-12-14 NOTE — H&P (Signed)
Melissa Henderson is a 30 y.o. female presenting for active labor and impending delivery. Gets care in [place] Maternal Medical History:  Reason for admission: Contractions.   Contractions: Onset was yesterday.   Frequency: regular.   Perceived severity is strong.    Fetal activity: Perceived fetal activity is normal.   Last perceived fetal movement was within the past hour.    Prenatal complications: no prenatal complications Prenatal Complications - Diabetes: none.    OB History    Gravida Para Term Preterm AB TAB SAB Ectopic Multiple Living   Past Medical History  Diagnosis Date  . Medical history non-contributory    Past Surgical History  Procedure Laterality Date  . Hernia repair    . Dilation and curettage of uterus     Family History: family history includes Diabetes in her maternal grandmother; Heart disease in her maternal grandmother; Hypertension in her maternal grandmother; Stroke in her paternal grandmother. Social History:  reports that she has quit smoking. Her smoking use included Cigarettes. She has never used smokeless tobacco. She reports that she drinks alcohol. She reports that she does not use illicit drugs.   Prenatal Transfer Tool  Maternal Diabetes: No Genetic Screening: Normal Maternal Ultrasounds/Referrals: Normal Fetal Ultrasounds or other Referrals:  None Maternal Substance Abuse:  No Significant Maternal Medications:  None Significant Maternal Lab Results:  None Other Comments:  None  Review of Systems  Constitutional: Negative.   HENT: Negative.   Eyes: Negative.   Respiratory: Negative.   Cardiovascular: Negative.   Gastrointestinal: Positive for abdominal pain.  Genitourinary: Negative.   Musculoskeletal: Negative.   Skin: Negative.   Neurological: Negative.   Endo/Heme/Allergies: Negative.   Psychiatric/Behavioral: Negative.     Dilation: 9 Exam by:: A. Garvey RN Blood pressure 131/88, pulse 96. Maternal  Exam:  Uterine Assessment: Contraction strength is firm.  Contraction frequency is regular.   Abdomen: Patient reports no abdominal tenderness. Fetal presentation: vertex  Introitus: Normal vulva. Normal vagina.  Amniotic fluid character: clear.  Pelvis: adequate for delivery.   Cervix: Cervix evaluated by digital exam.     Fetal Exam Fetal Monitor Review: Mode: fetal scalp electrode.   Variability: moderate (6-25 bpm).    Fetal State Assessment: Category II - tracings are indeterminate.     Physical Exam  Constitutional: She is oriented to person, place, and time. She appears well-developed and well-nourished.  HENT:  Head: Normocephalic.  Eyes: Pupils are equal, round, and reactive to light.  Neck: Normal range of motion.  Cardiovascular: Normal rate, regular rhythm, normal heart sounds and intact distal pulses.   Respiratory: Effort normal and breath sounds normal.  GI: Soft. Bowel sounds are normal.  Genitourinary: Vagina normal.  Musculoskeletal: Normal range of motion.  Neurological: She is alert and oriented to person, place, and time. She has normal reflexes.  Skin: Skin is warm and dry.  Psychiatric: She has a normal mood and affect. Her behavior is normal. Judgment and thought content normal.    Prenatal labs: ABO, Rh:   Antibody:   Rubella:   RPR:    HBsAg:    HIV:    GBS:     Assessment/Plan: SVE complete and +2, admit   Divonte Senger DARLENE 12/14/2015, 5:14 PM

## 2015-12-15 LAB — RPR: RPR: NONREACTIVE

## 2015-12-15 MED ORDER — IBUPROFEN 600 MG PO TABS: 600.0000 mg | ORAL_TABLET | Freq: Four times a day (QID) | ORAL | Status: DC

## 2015-12-15 NOTE — Lactation Note (Signed)
This note was copied from the chart of Melissa Henderson. Lactation Consultation Note  Patient Name: Melissa Henderson [id number] Date: 12/15/2015 Reason for consult: Initial assessment Mom c/o of nipple tenderness, no breakdown noted. Assisted Mom with positioning to obtain more depth with latch. Mom reports less discomfort. Basic teaching reviewed, Encouraged to continue to BF with ques. Lactation brochure left for review, advised of OP services and support group. Advised to apply EBM to nipples for tenderness. Encouraged to call for assist as needed.   Maternal Data Has patient been taught Hand Expression?: Yes Does the patient have breastfeeding experience prior to this delivery?: Yes  Feeding Feeding Type: Breast Fed Length of feed: 15 min  LATCH Score/Interventions Latch: Grasps breast easily, tongue down, lips flanged, rhythmical sucking.  Audible Swallowing: A few with stimulation  Type of Nipple: Everted at rest and after stimulation  Comfort (Breast/Nipple): Filling, red/small blisters or bruises, mild/mod discomfort  Problem noted: Mild/Moderate discomfort Interventions (Mild/moderate discomfort): Hand massage;Hand expression  Hold (Positioning): Assistance needed to correctly position infant at breast and maintain latch. Intervention(s): Breastfeeding basics reviewed;Support Pillows;Position options;Skin to skin  LATCH Score: 7  Lactation Tools Discussed/Used WIC Program: Yes   Consult Status Consult Status: Follow-up Date: 12/16/15 Follow-up type: In-patient    Alfred Levins 12/15/2015, 6:18 PM

## 2015-12-15 NOTE — Discharge Summary (Signed)
OB Discharge Summary  Patient Name: Melissa Henderson DOB: 05-02-1986 MRN: [id number]  Date of admission: 12/14/2015 Delivering MD: Wyvonnia Dusky D   Date of discharge: 12/15/2015  Admitting diagnosis: 40 WKS, CTXS Intrauterine pregnancy: [redacted]w[redacted]d     Secondary diagnosis:Active Problems:   * No active hospital problems. *  Additional problems:none     Discharge diagnosis: Term Pregnancy Delivered                                                                     Post partum procedures:none  Augmentation: none  Complications: None  Hospital course:  Onset of Labor With Vaginal Delivery     30 y.o. yo [id number] at [redacted]w[redacted]d was admitted in Active Labor on 12/14/2015. Patient had an uncomplicated labor course as follows:  Membrane Rupture Time/Date:   ,12/14/2015   Intrapartum Procedures: Episiotomy: None [1]                                         Lacerations:  None [1]  Patient had a delivery of a Viable infant. 12/14/2015  Information for the patient's newborn:  Kasiya, Burck [[id number]]  Delivery Method: Vaginal, Spontaneous Delivery (Filed from Delivery Summary)    Pateint had an uncomplicated postpartum course.  She is ambulating, tolerating a regular diet, passing flatus, and urinating well. Patient is discharged home in stable condition on 12/15/2015.    Physical exam  Filed Vitals:   12/14/15 1835 12/14/15 1916 12/15/15 0100 12/15/15 0623  BP: 137/83 127/82 117/72 105/69  Pulse: 81 75 74 70  Temp:  98.2 F (36.8 C) 97.8 F (36.6 C) 98.2 F (36.8 C)  TempSrc:  Oral Oral Oral  Resp: Height:      Weight:       General: alert, cooperative and no distress Lochia: appropriate Uterine Fundus: firm Incision: N/A DVT Evaluation: Negative Homan's sign. No cords or calf tenderness. Labs: Lab Results  Component Value Date   WBC 16.2* 12/14/2015   HGB 11.1* 12/14/2015   HCT 33.2* 12/14/2015   MCV 89.2 12/14/2015   PLT 154 12/14/2015   CMP Latest  Ref Rng 06/14/2012  Glucose 70 - 99 mg/dL 295(M)  BUN 6 - 23 mg/dL 5(L)  Creatinine 8.41 - 1.10 mg/dL 3.24  Sodium 401 - 027 mEq/L 136  Potassium 3.5 - 5.1 mEq/L 3.8  Chloride 96 - 112 mEq/L 102    Discharge instruction: per After Visit Summary and "Baby and Me Booklet".  After Visit Meds:    Medication List    ASK your doctor about these medications        acetaminophen 500 MG tablet  Commonly known as:  TYLENOL  Take 500 mg by mouth every 6 (six) hours as needed for moderate pain.     polycarbophil 625 MG tablet  Commonly known as:  FIBERCON  Take 1 tablet (625 mg total) by mouth daily.     PRENATAL COMPLETE 14-0.4 MG Tabs  Take 1 tablet by mouth daily.        Diet: routine diet  Activity: Advance as tolerated. Pelvic rest  for 6 weeks.   Outpatient follow up:6 weeks Follow up Appt:No future appointments. Follow up visit: No Follow-up on file.  Postpartum contraception: Undecided  Newborn Data: Live born female  Birth Weight: 6 lb 12.8 oz (3085 g) APGAR: 8, 9  Baby Feeding: Bottle Disposition:home with mother   12/15/2015 Ferdie Ping, CNM

## 2015-12-16 ENCOUNTER — Ambulatory Visit: Payer: Self-pay

## 2015-12-16 NOTE — Lactation Note (Addendum)
This note was copied from the chart of Melissa Henderson. Lactation Consultation Note  Patient Name: Melissa Sariya Trickey [id number] Date: 12/16/2015 Reason for consult: Follow-up assessment  Baby 41 hours old, with hyperbilirubinemia. Baby supposed to be on double phototherapy, but FOB holding baby without either of the lights on baby. Mom states that she has been putting baby to breast first and then supplementing with formula and intends to continue feeding baby this way. Mom states that her breasts are starting to fill, and she is about to use the hand pump. Mom reports that she will be getting a DEBP from [facility]. Enc mom to use DEBP in room for additional stimulation of breasts, but mom states that she is about to be discharged. Discussed with patient's nurse Herbert Seta, RN, that baby is not on photo lights.   Mom aware of OP/BFSG and LC phone line assistance after D/C. Mom states that she thinks she will come to support group after D/C.   Maternal Data    Feeding Feeding Type: Formula Nipple Type: Slow - flow  LATCH Score/Interventions                      Lactation Tools Discussed/Used     Consult Status Consult Status: Follow-up Date: 12/17/15 Follow-up type: In-patient    Geralynn Ochs 12/16/2015, 10:34 AM

## 2015-12-16 NOTE — Progress Notes (Signed)
CLINICAL SOCIAL WORK MATERNAL/CHILD NOTE  Patient Details  Name: Melissa Henderson MRN: [id number] Date of Birth: 12/14/2017  Date: 12/16/2015  Clinical Social Worker Initiating Note: Marisue Ivan, MSW intern Date/ Time Initiated: 12/16/15/0830   Child's Name: Melissa Henderson    Legal Guardian: Loney Hering and Elyn Peers   Need for Interpreter: None   Date of Referral: 12/14/15   Reason for Referral: Current Substance Use/Substance Use During Pregnancy    Referral Source: [facility]   Address: [address] [place], [place] [address]  Phone number: [phone]   Household Members: Self, Minor Children, Significant Other   Natural Supports (not living in the home): Immediate Family, Spouse/significant other, Parent   Professional Supports:None   Employment:Part-time   Type of Work: Materials engineer   Education: Associate Professor Resources:Medicaid   Other Resources: [employer], Sales executive    Cultural/Religious Considerations Which May Impact Care: None Reported   Strengths: Ability to meet basic needs , Home prepared for child , Pediatrician chosen    Risk Factors/Current Problems: Substance Use - History of marijuana use during the pregnancy. Infant's UDS came back positive for THC.   Cognitive State: Insightful , Linear Thinking , Alert    Mood/Affect: Happy , Interested , Calm , Comfortable , Relaxed    CSW Assessment: MSW intern presented in patient's room due to a consult being placed by the central nursery because of being unable to acquire prenatal records. However, prenatal records have been acquired and reported MOB consumed THC during the pregnancy. MOB's three children and FOB were in the room sleeping when MSW intern entered the room. MOB provided verbal consent for MSW intern to engage. MOB presented to be in a happy mood as evidence by her caring for the infant and openly answering the  assessment questions. MOB voiced being ready to go home and waiting on the results of the infant's lab work. MOB shared the birthing process went well and she was transitioning well into postpartum. MOB stated that she is both breast and bottle feeding because her nipples get sore and start to bleed, therefore it is easier for her to alternate between the two. MOB disclosed she lives in the home with FOB and has three children ages 32, 21, and 48. Per MOB, she is the only one that works in the home and plans to go back in a few weeks. 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 MOB reported that her mother will be helping care for the infant once she goes back to work and expressed her being a good support system for her. MOB voiced having the home prepared for the infant and meeting the infant's basic needs.   MSW intern inquired about MOB's mental health during the pregnancy. MOB denied any history of mental health diagnosis or going through any perinatal mood disorders after her last three children. MSW intern provided education on perinatal mood disorders and informed MOB about the hospital's support group. MOB voiced interest in the hospital's support group but denied any mental health concerns postpartum. MOB agreed to contact her OB if needs arise and thanked MSW intern for the education provided.   MSW informed MOB about the hospital's policy when prenatal records are not obtained. Per chart review her records are now available but were not when the UDS and cord tissue drug screens were sent out. MSW intern shared with MOB the hospital's drug  screening policy and procedures if a test were to come out positive. MSW intern informed MOB that the infant's UDS came back positive and therefore would have to file a CPS report in [facility]. MOB expressed smoking marijuana during the time she was pregnant with her 32 year old but never going through the CPS process. MOB  disclosed she smoked marijuana to help her with her nausea and increase her appetite. Per MOB, she had been very honest with her OB about her substance use and shared that her last time smoking marijuana was about three months ago. MOB denied using any other substances throughout this pregnancy. MOB acknowledged the severity of the issue and was okay with the CPS report being filed. MOB shared that a close family member had been through the process with CPS and had lost her children because she was using Cocaine. However, MOB reassured MSW intern that wasn't the case with her and she would never jeopardize her being with her children and has never used anything else besides marijuana. MOB at this point asked about the process and what would happen with a case like this. MSW intern explained the process to the best of her ability from what she has seen in previous cases and reassured MOB that CPS is mainly concerned for the infants safety and well-being. MOB thanked MSW intern for the clarification and denied having any further questions or concerns. MOB agreed to contact MSW intern if needs arise. MSW intern verified all contact information with MOB and left her name on the baby board for MOB's future reference in case needs arise.     CSW Plan/Description:  Restaurant manager, fast food- MSW intern provided education on perinatal mood disorders and the hospital's support group, Feelings After Birth.  Child Protective Service Report- MSW intern to file a CPS report due to infant's UDS testing positive for THC. Cord tissue still pending.  No Further Intervention Required/No Barriers to Discharge    Kandis Cocking, Student-SW 12/16/2015, 8:52 AM       Cosigned by: Pervis Hocking, LCSW at 12/16/2015 4:23 PM  Revision History     Date/Time User Provider Type Action   12/16/2015 4:23 PM Pervis Hocking, LCSW Social Worker Cosign   12/16/2015 4:20 PM Kandis Cocking, Student-SW Student-Social  Worker Sign   12/16/2015 1:14 PM Kandis Cocking, Student-SW Student-Social Worker Sign   View Details Report

## 2017-08-01 ENCOUNTER — Emergency Department (HOSPITAL_BASED_OUTPATIENT_CLINIC_OR_DEPARTMENT_OTHER)
Admission: EM | Admit: 2017-08-01 | Discharge: 2017-08-01 | Disposition: A | Discharge status: Home / Self Care | Payer: Medicaid Other | Attending: Emergency Medicine | Admitting: Emergency Medicine

## 2017-08-01 ENCOUNTER — Encounter (HOSPITAL_BASED_OUTPATIENT_CLINIC_OR_DEPARTMENT_OTHER): Payer: Self-pay

## 2017-08-01 DIAGNOSIS — Z5321 Procedure and treatment not carried out due to patient leaving prior to being seen by health care provider: Secondary | ICD-10-CM | POA: Insufficient documentation

## 2017-08-01 DIAGNOSIS — R1013 Epigastric pain: Secondary | ICD-10-CM | POA: Diagnosis not present

## 2017-08-01 NOTE — ED Notes (Signed)
Checked both lobbies and pt not found

## 2017-08-01 NOTE — ED Notes (Signed)
2nd call no answer

## 2017-08-01 NOTE — ED Notes (Signed)
Called x 1 for room, no answer. Pt not found in either lobby 

## 2017-08-01 NOTE — ED Triage Notes (Signed)
Pt reports "spider bite" under umbilicus since yesterday.

## 2020-05-17 ENCOUNTER — Other Ambulatory Visit: Payer: Self-pay

## 2020-05-17 ENCOUNTER — Encounter (HOSPITAL_BASED_OUTPATIENT_CLINIC_OR_DEPARTMENT_OTHER): Payer: Self-pay | Admitting: Emergency Medicine

## 2020-05-17 ENCOUNTER — Emergency Department (HOSPITAL_BASED_OUTPATIENT_CLINIC_OR_DEPARTMENT_OTHER)
Admission: EM | Admit: 2020-05-17 | Discharge: 2020-05-17 | Disposition: A | Discharge status: Home / Self Care | Payer: Medicaid Other | Attending: Emergency Medicine | Admitting: Emergency Medicine

## 2020-05-17 DIAGNOSIS — K0889 Other specified disorders of teeth and supporting structures: Secondary | ICD-10-CM | POA: Insufficient documentation

## 2020-05-17 DIAGNOSIS — Z87891 Personal history of nicotine dependence: Secondary | ICD-10-CM | POA: Insufficient documentation

## 2020-05-17 DIAGNOSIS — Z792 Long term (current) use of antibiotics: Secondary | ICD-10-CM | POA: Diagnosis not present

## 2020-05-17 DIAGNOSIS — Z79899 Other long term (current) drug therapy: Secondary | ICD-10-CM | POA: Insufficient documentation

## 2020-05-17 DIAGNOSIS — I10 Essential (primary) hypertension: Secondary | ICD-10-CM | POA: Diagnosis not present

## 2020-05-17 HISTORY — DX: Essential (primary) hypertension: I10

## 2020-05-17 MED ORDER — LIDOCAINE VISCOUS HCL 2 % MT SOLN: 15.0000 mL | OROMUCOSAL | 0 refills | Status: DC | PRN

## 2020-05-17 MED ORDER — NAPROXEN 250 MG PO TABS
500.0000 mg | ORAL_TABLET | Freq: Once | ORAL | Status: AC
  Administered 2020-05-17: 500 mg via ORAL
  Filled 2020-05-17: qty 2

## 2020-05-17 MED ORDER — NAPROXEN 500 MG PO TABS: 500.0000 mg | ORAL_TABLET | Freq: Two times a day (BID) | ORAL | 0 refills | Status: DC

## 2020-05-17 MED ORDER — AMOXICILLIN 500 MG PO CAPS: 500.0000 mg | ORAL_CAPSULE | Freq: Three times a day (TID) | ORAL | 0 refills | Status: AC

## 2020-05-17 NOTE — Discharge Instructions (Signed)
Take antibiotics as prescribed.  Take the entire course, even if your symptoms improve. Take naproxen 2 times a day with meals.  Do not take other anti-inflammatories at the same time open (Advil, Motrin, ibuprofen, Aleve). You may supplement with Tylenol if you need further pain control. Use viscous lidocaine to help with pain control. Follow-up with your dentist at your scheduled appointment in 4 days. Return to the emergency room if you develop high fevers, inability to open your mouth, difficulty swallowing, or any new, worsening, or concerning symptoms.

## 2020-05-17 NOTE — ED Triage Notes (Signed)
L upper dental pain x 1 week.

## 2020-05-17 NOTE — ED Provider Notes (Signed)
[facility] EMERGENCY DEPARTMENT Provider Note   CSN: [id number] Arrival date & time: 05/17/20  1431     History Chief Complaint  Patient presents with  . Dental Pain    Melissa Henderson is a 34 y.o. female presenting for evaluation of dental pain.  Patient states for the past week, she has had persistent and worsening left upper tooth pain.  She has been taking Tylenol and Orajel without improvement.  She reports a history of problems with this tooth, as well as several others.  She has a dentist appointment scheduled in 4 days, but states she cannot tolerate the pain until then.  She denies fevers, chills, difficulty opening her mouth, difficulty swallowing.  She has no other medical problems, takes medications daily.  Additionally, patient states Avril days ago she was using a Q-tip when she noticed blood in her right ear canal.  She had bleeding immediately afterwards, none since then. No ear pain.    HPI     Past Medical History:  Diagnosis Date  . Hypertension   . Medical history non-contributory     There are no problems to display for this patient.   Past Surgical History:  Procedure Laterality Date  . DILATION AND CURETTAGE OF UTERUS    . HERNIA REPAIR       OB History    Gravida  6   Para  1   Term  1   Preterm      AB  2   Living  1     SAB      TAB  2   Ectopic      Multiple  0   Live Births  1           Family History  Problem Relation Age of Onset  . Diabetes Maternal Grandmother   . Heart disease Maternal Grandmother   . Hypertension Maternal Grandmother   . Stroke Paternal Grandmother     Social History   Tobacco Use  . Smoking status: Former Smoker    Types: Cigarettes  . Smokeless tobacco: Never Used  . Tobacco comment: smokes 2 cigs/day  Substance Use Topics  . Alcohol use: Yes    Comment: occasionally  . Drug use: No    Home Medications Prior to Admission medications   Medication Sig Start Date End  Date Taking? Authorizing Provider  amoxicillin (AMOXIL) 500 MG capsule Take 1 capsule (500 mg total) by mouth 3 (three) times daily for 7 days. 05/17/20 05/24/20  Jemya Depierro, PA-C  ibuprofen (ADVIL,MOTRIN) 600 MG tablet Take 1 tablet (600 mg total) by mouth every 6 (six) hours. 12/15/15   Montez Morita, CNM  lidocaine (XYLOCAINE) 2 % solution Use as directed 15 mLs in the mouth or throat as needed for mouth pain. 05/17/20   Nollie Shiflett, PA-C  naproxen (NAPROSYN) 500 MG tablet Take 1 tablet (500 mg total) by mouth 2 (two) times daily with a meal. 05/17/20   Huxton Glaus, PA-C  polycarbophil (FIBERCON) 625 MG tablet Take 1 tablet (625 mg total) by mouth daily. Patient not taking: Reported on 12/14/2015 07/28/15   Aviva Signs, CNM  Prenatal Vit-Fe Fumarate-FA (PRENATAL COMPLETE) 14-0.4 MG TABS Take 1 tablet by mouth daily. Patient taking differently: Take 2 tablets by mouth daily.  06/14/12   Trixie Dredge B, PA-C    Allergies    Patient has no known allergies.  Review of Systems   Review of Systems  Constitutional: Negative for  fever.  HENT: Positive for dental problem.        R ear bleeding, resolved    Physical Exam Updated Vital Signs BP 122/85   Pulse 90   Temp 98.9 F (37.2 C) (Oral)   Resp 18   Ht 5\' 5"  (1.651 m)   Wt 83.9 kg   SpO2 99%   BMI 30.79 kg/m   Physical Exam Vitals and nursing note reviewed.  Constitutional:      General: She is not in acute distress.    Appearance: She is well-developed.  HENT:     Head: Normocephalic and atraumatic.     Ears:     Comments: Bilateral ear canals without bleeding, trauma, abrasion, or dried blood.  TMs nonerythematous nonbulging bilaterally.  No blood behind the TM.    Mouth/Throat:     Dentition: Abnormal dentition. Dental tenderness and dental caries present.      Comments: Overall poor dentition, patient with partial and.  Tenderness palpation of left upper tooth.  Left front tooth is cracked and mildly  tender.  No visible area of fluctuance/abscess that is drainable.  No trismus.  No pain under the tongue.  Handling secretions easily.  No significant facial swelling. Pulmonary:     Effort: Pulmonary effort is normal.  Abdominal:     General: There is no distension.  Musculoskeletal:        General: Normal range of motion.     Cervical back: Normal range of motion.  Skin:    General: Skin is warm.     Findings: No rash.  Neurological:     Mental Status: She is alert and oriented to person, place, and time.     ED Results / Procedures / Treatments   Labs (all labs ordered are listed, but only abnormal results are displayed) Labs Reviewed - No data to display  EKG None  Radiology No results found.  Procedures Procedures (including critical care time)  Medications Ordered in ED Medications  naproxen (NAPROSYN) tablet 500 mg (500 mg Oral Given 05/17/20 1545)    ED Course  I have reviewed the triage vital signs and the nursing notes.  Pertinent labs & imaging results that were available during my care of the patient were reviewed by me and considered in my medical decision making (see chart for details).    MDM Rules/Calculators/A&P                          Patient presenting for evaluation of dental pain.  On exam, patient appears nontoxic.  She is focal dental tenderness, likely early infection.  Exam is not consistent with Ludwick's.  Will treat with antibiotics, NSAIDs, and have patient follow-up with dentist as scheduled.  Additionally, patient reported right ear bleeding several days ago.  It has since resolved, there is no sign of trauma on my exam.  No blood behind the TM.  Likely scratch due to using a Q-tip, and since has healed.  At this time, patient appears safe for discharge.  Return precautions given.  Patient states she understands and agrees to plan.  Final Clinical Impression(s) / ED Diagnoses Final diagnoses:  None    Rx / DC Orders ED Discharge  Orders         Ordered    amoxicillin (AMOXIL) 500 MG capsule  3 times daily     Discontinue  Reprint     05/17/20 1522    naproxen (NAPROSYN) 500 MG  tablet  2 times daily with meals     Discontinue  Reprint     05/17/20 1522    lidocaine (XYLOCAINE) 2 % solution  As needed     Discontinue  Reprint     05/17/20 1522           Merari Pion, PA-C 05/17/20 1653    Virgina Norfolk, DO 05/18/20 [phone]

## 2020-09-18 ENCOUNTER — Encounter: Payer: Self-pay | Admitting: Family Medicine

## 2020-09-18 ENCOUNTER — Other Ambulatory Visit: Payer: Self-pay

## 2020-09-18 ENCOUNTER — Ambulatory Visit (INDEPENDENT_AMBULATORY_CARE_PROVIDER_SITE_OTHER): Payer: Medicaid Other | Admitting: Family Medicine

## 2020-09-18 ENCOUNTER — Other Ambulatory Visit (HOSPITAL_COMMUNITY)
Admission: RE | Admit: 2020-09-18 | Discharge: 2020-09-18 | Disposition: A | Discharge status: Home / Self Care | Payer: Medicaid Other | Source: Ambulatory Visit | Attending: Family Medicine | Admitting: Family Medicine

## 2020-09-18 VITALS — BP 136/89 | HR 75 | Ht 65.0 in | Wt 192.1 lb

## 2020-09-18 DIAGNOSIS — Z01419 Encounter for gynecological examination (general) (routine) without abnormal findings: Secondary | ICD-10-CM | POA: Diagnosis present

## 2020-09-18 NOTE — Progress Notes (Signed)
Last pap: 2020 - Dr. Jeanice Lim [facility])

## 2020-09-18 NOTE — Progress Notes (Signed)
GYNECOLOGY ANNUAL PREVENTATIVE CARE ENCOUNTER NOTE  Subjective:   Melissa Henderson is a 34 y.o. [phone] female here for a routine annual gynecologic exam.  Current complaints: none.   Denies abnormal vaginal bleeding, discharge, pelvic pain, problems with intercourse or other gynecologic concerns.    Was on depo provera for 4 years. Came off a few months ago and had a period earlier this month.  Gynecologic History Patient's last menstrual period was 08/24/2020 (exact date). Patient is not sexually active  Contraception: none Last Pap: 2020. Results were: normal Last mammogram: n/a.  Obstetric History OB History  Gravida Para Term Preterm AB Living  6 4 4   2 4   SAB TAB Ectopic Multiple Live Births    2   0 4    # Outcome Date GA Lbr Len/2nd Weight Sex Delivery Anes PTL Lv  6 Term 12/14/15 [redacted]w[redacted]d 02:53 / 00:03 6 lb 12.8 oz (3.085 kg) F Vag-Spont None  LIV  5 Term 02/08/10    M Vag-Spont   LIV  4 Term 11/03/06    F Vag-Spont EPI  LIV  3 Term 11/03/04    F Vag-Spont   LIV  2 TAB           1 TAB             Past Medical History:  Diagnosis Date   Hypertension    Medical history non-contributory     Past Surgical History:  Procedure Laterality Date   DILATION AND CURETTAGE OF UTERUS     HERNIA REPAIR      Current Outpatient Medications on File Prior to Visit  Medication Sig Dispense Refill   ibuprofen (ADVIL,MOTRIN) 600 MG tablet Take 1 tablet (600 mg total) by mouth every 6 (six) hours. (Patient not taking: Reported on 09/18/2020) 30 tablet 0   lidocaine (XYLOCAINE) 2 % solution Use as directed 15 mLs in the mouth or throat as needed for mouth pain. (Patient not taking: Reported on 09/18/2020) 100 mL 0   naproxen (NAPROSYN) 500 MG tablet Take 1 tablet (500 mg total) by mouth 2 (two) times daily with a meal. (Patient not taking: Reported on 09/18/2020) 20 tablet 0   polycarbophil (FIBERCON) 625 MG tablet Take 1 tablet (625 mg total) by mouth daily. (Patient not  taking: Reported on 12/14/2015) 30 tablet 0   Prenatal Vit-Fe Fumarate-FA (PRENATAL COMPLETE) 14-0.4 MG TABS Take 1 tablet by mouth daily. (Patient not taking: Reported on 09/18/2020) 60 each 5   No current facility-administered medications on file prior to visit.    No Known Allergies  Social History   Socioeconomic History   Marital status: Single    Spouse name: Not on file   Number of children: Not on file   Years of education: Not on file   Highest education level: Not on file  Occupational History   Not on file  Tobacco Use   Smoking status: Former Smoker    Types: Cigarettes   Smokeless tobacco: Never Used   Tobacco comment: smokes 2 cigs/day  Substance and Sexual Activity   Alcohol use: Yes    Comment: occasionally   Drug use: Yes    Types: Marijuana   Sexual activity: Not Currently    Birth control/protection: None  Other Topics Concern   Not on file  Social History Narrative   Not on file   Social Determinants of Health   Financial Resource Strain:    Difficulty of Paying Living Expenses: Not  on file  Food Insecurity:    Worried About Programme researcher, broadcasting/film/video in the Last Year: Not on file   [employer] of Food in the Last Year: Not on file  Transportation Needs:    Lack of Transportation (Medical): Not on file   Lack of Transportation (Non-Medical): Not on file  Physical Activity:    Days of Exercise per Week: Not on file   Minutes of Exercise per Session: Not on file  Stress:    Feeling of Stress : Not on file  Social Connections:    Frequency of Communication with Friends and Family: Not on file   Frequency of Social Gatherings with Friends and Family: Not on file   Attends Religious Services: Not on file   Active Member of Clubs or Organizations: Not on file   Attends Banker Meetings: Not on file   Marital Status: Not on file  Intimate Partner Violence:    Fear of Current or Ex-Partner: Not on file   Emotionally  Abused: Not on file   Physically Abused: Not on file   Sexually Abused: Not on file    Family History  Problem Relation Age of Onset   Diabetes Maternal Grandmother    Heart disease Maternal Grandmother    Hypertension Maternal Grandmother    Stroke Paternal Grandmother     The following portions of the patient's history were reviewed and updated as appropriate: allergies, current medications, past family history, past medical history, past social history, past surgical history and problem list.  Review of Systems Pertinent items are noted in HPI.   Objective:  BP 136/89    Pulse 75    Ht 5\' 5"  (1.651 m)    Wt 192 lb 1.9 oz (87.1 kg)    LMP 08/24/2020 (Exact Date)    BMI 31.97 kg/m  Wt Readings from Last 3 Encounters:  09/18/20 192 lb 1.9 oz (87.1 kg)  05/17/20 185 lb (83.9 kg)  08/01/17 163 lb (73.9 kg)     Chaperone present during exam  CONSTITUTIONAL: Well-developed, well-nourished female in no acute distress.  HENT:  Normocephalic, atraumatic, External right and left ear normal. Oropharynx is clear and moist EYES: Conjunctivae and EOM are normal. Pupils are equal, round, and reactive to light. No scleral icterus.  NECK: Normal range of motion, supple, no masses.  Normal thyroid.   CARDIOVASCULAR: Normal heart rate noted, regular rhythm RESPIRATORY: Clear to auscultation bilaterally. Effort and breath sounds normal, no problems with respiration noted. BREASTS: Symmetric in size. No masses, skin changes, nipple drainage, or lymphadenopathy. ABDOMEN: Soft, normal bowel sounds, no distention noted.  No tenderness, rebound or guarding.  PELVIC: Normal appearing external genitalia; normal appearing vaginal mucosa and cervix.  No abnormal discharge noted.   MUSCULOSKELETAL: Normal range of motion. No tenderness.  No cyanosis, clubbing, or edema.  2+ distal pulses. SKIN: Skin is warm and dry. No rash noted. Not diaphoretic. No erythema. No pallor. NEUROLOGIC: Alert and  oriented to person, place, and time. Normal reflexes, muscle tone coordination. No cranial nerve deficit noted. PSYCHIATRIC: Normal mood and affect. Normal behavior. Normal judgment and thought content.  Assessment:  Annual gynecologic examination with pap smear   Plan:  1. Well Woman Exam Patient desired PAP this year. Will follow up results of pap smear and manage accordingly. STD testing discussed. Patient requested testing  - Cytology - PAP - CBC - Comprehensive metabolic panel - TSH - Hepatitis B surface antigen - Hepatitis C antibody - HIV  Antibody (routine testing w rflx) - RPR   Routine preventative health maintenance measures emphasized. Please refer to After Visit Summary for other counseling recommendations.    Candelaria Celeste, DO [facility]

## 2020-09-19 LAB — COMPREHENSIVE METABOLIC PANEL
ALT: 15 IU/L (ref 0–32)
AST: 16 IU/L (ref 0–40)
Albumin/Globulin Ratio: 1.6 (ref 1.2–2.2)
Albumin: 4.3 g/dL (ref 3.8–4.8)
Alkaline Phosphatase: 66 IU/L (ref 44–121)
BUN/Creatinine Ratio: 10 (ref 9–23)
BUN: 10 mg/dL (ref 6–20)
Bilirubin Total: 0.7 mg/dL (ref 0.0–1.2)
CO2: 21 mmol/L (ref 20–29)
Calcium: 9.6 mg/dL (ref 8.7–10.2)
Chloride: 104 mmol/L (ref 96–106)
Creatinine, Ser: 1 mg/dL (ref 0.57–1.00)
GFR calc Af Amer: 84 mL/min/{1.73_m2} (ref >59)
GFR calc non Af Amer: 73 mL/min/{1.73_m2} (ref >59)
Globulin, Total: 2.7 g/dL (ref 1.5–4.5)
Glucose: 84 mg/dL (ref 65–99)
Potassium: 4.3 mmol/L (ref 3.5–5.2)
Sodium: 141 mmol/L (ref 134–144)
Total Protein: 7 g/dL (ref 6.0–8.5)

## 2020-09-19 LAB — CBC
Hematocrit: 39.8 % (ref 34.0–46.6)
Hemoglobin: 12.8 g/dL (ref 11.1–15.9)
MCH: 28.5 pg (ref 26.6–33.0)
MCHC: 32.2 g/dL (ref 31.5–35.7)
MCV: 89 fL (ref 79–97)
Platelets: 297 10*3/uL (ref 150–450)
RBC: 4.49 x10E6/uL (ref 3.77–5.28)
RDW: 11.7 % (ref 11.7–15.4)
WBC: 4.7 10*3/uL (ref 3.4–10.8)

## 2020-09-19 LAB — HEPATITIS C ANTIBODY: Hep C Virus Ab: 0.2 s/co ratio (ref 0.0–0.9)

## 2020-09-19 LAB — TSH: TSH: 0.9 u[IU]/mL (ref 0.450–4.500)

## 2020-09-19 LAB — HEPATITIS B SURFACE ANTIGEN: Hepatitis B Surface Ag: NEGATIVE

## 2020-09-19 LAB — HIV ANTIBODY (ROUTINE TESTING W REFLEX): HIV Screen 4th Generation wRfx: NONREACTIVE

## 2020-09-19 LAB — RPR: RPR Ser Ql: NONREACTIVE

## 2020-09-22 ENCOUNTER — Telehealth: Payer: Self-pay

## 2020-09-22 LAB — CYTOLOGY - PAP
Chlamydia: NEGATIVE
Comment: NEGATIVE
Comment: NEGATIVE
Comment: NORMAL
Diagnosis: NEGATIVE
Diagnosis: REACTIVE
High risk HPV: NEGATIVE
Neisseria Gonorrhea: NEGATIVE

## 2020-09-22 NOTE — Telephone Encounter (Signed)
Left message for patient to return call to office. Ranessa Kosta  RN 

## 2020-11-19 ENCOUNTER — Other Ambulatory Visit: Payer: Self-pay

## 2020-11-19 DIAGNOSIS — Z3042 Encounter for surveillance of injectable contraceptive: Secondary | ICD-10-CM

## 2020-11-19 MED ORDER — MEDROXYPROGESTERONE ACETATE 150 MG/ML IM SUSP: 150.0000 mg | INTRAMUSCULAR | 3 refills | Status: DC

## 2020-11-26 ENCOUNTER — Ambulatory Visit (INDEPENDENT_AMBULATORY_CARE_PROVIDER_SITE_OTHER): Payer: Medicaid Other | Admitting: Family Medicine

## 2020-11-26 ENCOUNTER — Other Ambulatory Visit: Payer: Self-pay

## 2020-11-26 ENCOUNTER — Encounter: Payer: Self-pay | Admitting: Family Medicine

## 2020-11-26 VITALS — BP 124/76 | HR 62 | Wt 188.0 lb

## 2020-11-26 DIAGNOSIS — Z3042 Encounter for surveillance of injectable contraceptive: Secondary | ICD-10-CM

## 2020-11-26 DIAGNOSIS — R5383 Other fatigue: Secondary | ICD-10-CM

## 2020-11-26 LAB — POCT URINE PREGNANCY: Preg Test, Ur: NEGATIVE

## 2020-11-26 MED ORDER — MEDROXYPROGESTERONE ACETATE 150 MG/ML IM SUSP
150.0000 mg | Freq: Once | INTRAMUSCULAR | Status: AC
  Administered 2020-11-26: 150 mg via INTRAMUSCULAR

## 2020-11-26 NOTE — Progress Notes (Signed)
   Subjective:    Patient ID: Melissa Henderson, female    DOB: [birth year], 35 y.o.   MRN: [id number]  HPI Seen for initiation for Depo.  Still having some fatigue.    Review of Systems     Objective:   Physical Exam Vitals reviewed.  Constitutional:      Appearance: Normal appearance.  Cardiovascular:     Rate and Rhythm: Normal rate.  Pulmonary:     Effort: Pulmonary effort is normal.  Skin:    General: Skin is warm.  Neurological:     Mental Status: She is alert.  Psychiatric:        Mood and Affect: Mood normal.        Behavior: Behavior normal.        Thought Content: Thought content normal.        Judgment: Judgment normal.       Assessment & Plan:  1. Encounter for surveillance of injectable contraceptive Discussed Depo side effects. F/u every 3 months - POCT urine pregnancy - medroxyPROGESTERone (DEPO-PROVERA) injection 150 mg  2. Fatigue, unspecified type Recent TSH, CBC, CMP normal. Check Vit D level - VITAMIN D 25 Hydroxy (Vit-D Deficiency, Fractures)

## 2020-11-27 ENCOUNTER — Telehealth: Payer: Self-pay

## 2020-11-27 LAB — VITAMIN D 25 HYDROXY (VIT D DEFICIENCY, FRACTURES): Vit D, 25-Hydroxy: 19.8 ng/mL — ABNORMAL LOW (ref 30.0–100.0)

## 2020-11-27 MED ORDER — VITAMIN D (ERGOCALCIFEROL) 1.25 MG (50000 UNIT) PO CAPS: 50000.0000 [IU] | ORAL_CAPSULE | ORAL | 0 refills | Status: DC

## 2020-11-27 NOTE — Telephone Encounter (Signed)
-----   Message from Levie Heritage, DO sent at 11/27/2020  9:05 AM EST ----- Vit D level is low. Prescribed Vit D 50,000 units every 7 days for 10 weeks. Please let patient know.

## 2020-11-27 NOTE — Addendum Note (Signed)
Addended by: Levie Heritage on: 11/27/2020 09:05 AM   Modules accepted: Orders

## 2020-11-27 NOTE — Telephone Encounter (Signed)
Called patient and verified birthday. Patient made aware that vitamin D level was low and need for weekly vitamin D prescription. Patient states understanding. Armandina Stammer RN

## 2021-02-16 ENCOUNTER — Other Ambulatory Visit: Payer: Self-pay

## 2021-02-16 ENCOUNTER — Ambulatory Visit (INDEPENDENT_AMBULATORY_CARE_PROVIDER_SITE_OTHER): Payer: Medicaid Other

## 2021-02-16 VITALS — BP 124/74 | HR 72 | Wt 195.0 lb

## 2021-02-16 DIAGNOSIS — Z3042 Encounter for surveillance of injectable contraceptive: Secondary | ICD-10-CM | POA: Diagnosis not present

## 2021-02-16 MED ORDER — MEDROXYPROGESTERONE ACETATE 150 MG/ML IM SUSP
150.0000 mg | Freq: Once | INTRAMUSCULAR | Status: AC
  Administered 2021-02-16: 150 mg via INTRAMUSCULAR

## 2021-02-16 NOTE — Progress Notes (Addendum)
Everette Rank here for Depo-Provera  Injection.  Injection administered without complication. Patient will return in 3 months for next injection.  Roth Ress l Azaria Stegman, CMA 02/16/2021  1:37 PM  Attestation of Attending Supervision of CMA/RN: Evaluation and management procedures were performed by the nurse under my supervision and collaboration.  I have reviewed the nursing note and chart, and I agree with the management and plan.  Carolyn L. Harraway-Smith, M.D., [facility]

## 2021-05-04 ENCOUNTER — Other Ambulatory Visit: Payer: Self-pay

## 2021-05-04 ENCOUNTER — Ambulatory Visit (INDEPENDENT_AMBULATORY_CARE_PROVIDER_SITE_OTHER): Payer: Medicaid Other

## 2021-05-04 VITALS — BP 116/69 | HR 75 | Wt 199.0 lb

## 2021-05-04 DIAGNOSIS — Z3042 Encounter for surveillance of injectable contraceptive: Secondary | ICD-10-CM

## 2021-05-04 MED ORDER — MEDROXYPROGESTERONE ACETATE 150 MG/ML IM SUSP
150.0000 mg | Freq: Once | INTRAMUSCULAR | Status: AC
  Administered 2021-05-04: 150 mg via INTRAMUSCULAR

## 2021-05-04 NOTE — Progress Notes (Addendum)
Everette Rank here for Depo-Provera  Injection.  Injection administered without complication. Patient will return in 3 months for next injection.  Dammon Makarewicz l Harinder Romas, CMA 05/04/2021  1:37 PM  Attestation of Attending Supervision of CMA/RN: Evaluation and management procedures were performed by the nurse under my supervision and collaboration.  I have reviewed the nursing note and chart, and I agree with the management and plan.  Carolyn L. Harraway-Smith, M.D., [facility] Core

## 2021-07-20 ENCOUNTER — Ambulatory Visit: Payer: Medicaid Other

## 2021-08-05 ENCOUNTER — Encounter: Payer: Self-pay | Admitting: General Practice

## 2022-01-29 ENCOUNTER — Ambulatory Visit: Payer: Medicaid Other | Admitting: Family Medicine

## 2022-05-20 ENCOUNTER — Other Ambulatory Visit (HOSPITAL_COMMUNITY)
Admission: RE | Admit: 2022-05-20 | Discharge: 2022-05-20 | Disposition: A | Discharge status: Home / Self Care | Payer: Medicaid Other | Source: Ambulatory Visit | Attending: Family Medicine | Admitting: Family Medicine

## 2022-05-20 ENCOUNTER — Encounter: Payer: Self-pay | Admitting: Family Medicine

## 2022-05-20 ENCOUNTER — Telehealth: Payer: Self-pay | Admitting: General Practice

## 2022-05-20 ENCOUNTER — Other Ambulatory Visit (HOSPITAL_BASED_OUTPATIENT_CLINIC_OR_DEPARTMENT_OTHER): Payer: Self-pay

## 2022-05-20 ENCOUNTER — Ambulatory Visit: Payer: Medicaid Other | Admitting: Family Medicine

## 2022-05-20 VITALS — BP 139/88 | HR 60 | Ht 64.0 in | Wt 180.0 lb

## 2022-05-20 DIAGNOSIS — Z01419 Encounter for gynecological examination (general) (routine) without abnormal findings: Secondary | ICD-10-CM | POA: Diagnosis present

## 2022-05-20 DIAGNOSIS — Z3202 Encounter for pregnancy test, result negative: Secondary | ICD-10-CM | POA: Diagnosis not present

## 2022-05-20 DIAGNOSIS — Z0000 Encounter for general adult medical examination without abnormal findings: Secondary | ICD-10-CM

## 2022-05-20 DIAGNOSIS — Z3042 Encounter for surveillance of injectable contraceptive: Secondary | ICD-10-CM

## 2022-05-20 LAB — POCT URINE PREGNANCY: Preg Test, Ur: NEGATIVE

## 2022-05-20 MED ORDER — MEDROXYPROGESTERONE ACETATE 150 MG/ML IM SUSP
150.0000 mg | Freq: Once | INTRAMUSCULAR | Status: AC
  Administered 2022-05-20: 150 mg via INTRAMUSCULAR

## 2022-05-20 MED ORDER — MEDROXYPROGESTERONE ACETATE 150 MG/ML IM SUSP
150.0000 mg | Freq: Once | INTRAMUSCULAR | Status: AC
  Administered 2023-04-19: 150 mg via INTRAMUSCULAR

## 2022-05-20 MED ORDER — MEDROXYPROGESTERONE ACETATE 150 MG/ML IM SUSY
150.0000 mg | PREFILLED_SYRINGE | INTRAMUSCULAR | 3 refills | Status: DC
  Filled 2022-05-20: qty 1, 84d supply, fill #0

## 2022-05-20 NOTE — Progress Notes (Signed)
GYNECOLOGY ANNUAL PREVENTATIVE CARE ENCOUNTER NOTE  Subjective:   Melissa Henderson is a 36 y.o. [phone] female here for a routine annual gynecologic exam.  Current complaints: none.   Denies abnormal vaginal bleeding, discharge, pelvic pain, problems with intercourse or other gynecologic concerns.    Gynecologic History Patient's last menstrual period was 05/09/2022 (exact date). Patient is sexually active  Contraception: Depo-Provera injections Last Pap: 2021. Results were: normal Last mammogram: n/a.   The pregnancy intention screening data noted above was reviewed. Potential methods of contraception were discussed. The patient elected to proceed with No data recorded.   Obstetric History OB History  Gravida Para Term Preterm AB Living  6 4 4   2 4   SAB IAB Ectopic Multiple Live Births    2   0 4    # Outcome Date GA Lbr Len/2nd Weight Sex Delivery Anes PTL Lv  6 Term 12/14/15 [redacted]w[redacted]d 02:53 / 00:03 6 lb 12.8 oz (3.085 kg) F Vag-Spont None  LIV  5 Term 02/08/10    M Vag-Spont   LIV  4 Term 11/03/06    F Vag-Spont EPI  LIV  3 Term 11/03/04    F Vag-Spont   LIV  2 IAB           1 IAB             Past Medical History:  Diagnosis Date   Hypertension    Medical history non-contributory     Past Surgical History:  Procedure Laterality Date   DILATION AND CURETTAGE OF UTERUS     HERNIA REPAIR      Current Outpatient Medications on File Prior to Visit  Medication Sig Dispense Refill   metroNIDAZOLE (FLAGYL) 500 MG tablet Take 500 mg by mouth 3 (three) times daily.     ibuprofen (ADVIL,MOTRIN) 600 MG tablet Take 1 tablet (600 mg total) by mouth every 6 (six) hours. (Patient not taking: No sig reported) 30 tablet 0   lidocaine (XYLOCAINE) 2 % solution Use as directed 15 mLs in the mouth or throat as needed for mouth pain. (Patient not taking: No sig reported) 100 mL 0   medroxyPROGESTERone (DEPO-PROVERA) 150 MG/ML injection Inject 1 mL (150 mg total) into the muscle every 3  (three) months. (Patient not taking: No sig reported) 1 mL 3   naproxen (NAPROSYN) 500 MG tablet Take 1 tablet (500 mg total) by mouth 2 (two) times daily with a meal. (Patient not taking: Reported on 05/04/2021) 20 tablet 0   polycarbophil (FIBERCON) 625 MG tablet Take 1 tablet (625 mg total) by mouth daily. (Patient not taking: No sig reported) 30 tablet 0   Prenatal Vit-Fe Fumarate-FA (PRENATAL COMPLETE) 14-0.4 MG TABS Take 1 tablet by mouth daily. (Patient not taking: No sig reported) 60 each 5   Vitamin D, Ergocalciferol, (DRISDOL) 1.25 MG (50000 UNIT) CAPS capsule Take 1 capsule (50,000 Units total) by mouth every 7 (seven) days. (Patient not taking: No sig reported) 10 capsule 0   No current facility-administered medications on file prior to visit.    No Known Allergies  Social History   Socioeconomic History   Marital status: Single    Spouse name: Not on file   Number of children: Not on file   Years of education: Not on file   Highest education level: Not on file  Occupational History   Not on file  Tobacco Use   Smoking status: Former    Types: Cigarettes   Smokeless  tobacco: Never   Tobacco comments:    smokes 2 cigs/day  Substance and Sexual Activity   Alcohol use: Yes    Comment: occasionally   Drug use: Yes    Types: Marijuana   Sexual activity: Not Currently    Birth control/protection: None  Other Topics Concern   Not on file  Social History Narrative   Not on file   Social Determinants of Health   Financial Resource Strain: Not on file  Food Insecurity: Not on file  Transportation Needs: Not on file  Physical Activity: Not on file  Stress: Not on file  Social Connections: Not on file  Intimate Partner Violence: Not on file    Family History  Problem Relation Age of Onset   Diabetes Maternal Grandmother    Heart disease Maternal Grandmother    Hypertension Maternal Grandmother    Stroke Paternal Grandmother     The following portions of the  patient's history were reviewed and updated as appropriate: allergies, current medications, past family history, past medical history, past social history, past surgical history and problem list.  Review of Systems Pertinent items are noted in HPI.   Objective:  BP 139/88   Pulse 60   Ht 5\' 4"  (1.626 m)   Wt 180 lb (81.6 kg)   LMP 05/09/2022 (Exact Date)   BMI 30.90 kg/m  Wt Readings from Last 3 Encounters:  05/20/22 180 lb (81.6 kg)  05/04/21 199 lb (90.3 kg)  02/16/21 195 lb (88.5 kg)     Chaperone present during exam  CONSTITUTIONAL: Well-developed, well-nourished female in no acute distress.  HENT:  Normocephalic, atraumatic, External right and left ear normal. Oropharynx is clear and moist EYES: Conjunctivae and EOM are normal. Pupils are equal, round, and reactive to light. No scleral icterus.  NECK: Normal range of motion, supple, no masses.  Normal thyroid.   CARDIOVASCULAR: Normal heart rate noted, regular rhythm RESPIRATORY: Clear to auscultation bilaterally. Effort and breath sounds normal, no problems with respiration noted. BREASTS: Symmetric in size. No masses, skin changes, nipple drainage, or lymphadenopathy. ABDOMEN: Soft, normal bowel sounds, no distention noted.  No tenderness, rebound or guarding.  PELVIC: Normal appearing external genitalia; normal appearing vaginal mucosa and cervix.  No abnormal discharge noted.  Normal uterine size, no other palpable masses, no uterine or adnexal tenderness. MUSCULOSKELETAL: Normal range of motion. No tenderness.  No cyanosis, clubbing, or edema.  2+ distal pulses. SKIN: Skin is warm and dry. No rash noted. Not diaphoretic. No erythema. No pallor. NEUROLOGIC: Alert and oriented to person, place, and time. Normal reflexes, muscle tone coordination. No cranial nerve deficit noted. PSYCHIATRIC: Normal mood and affect. Normal behavior. Normal judgment and thought content.  Assessment:  Annual gynecologic examination with pap  smear   Plan:  1. Well Woman Exam Will follow up results of pap smear and manage accordingly. STD testing discussed. Patient requested testing   Routine preventative health maintenance measures emphasized. Please refer to After Visit Summary for other counseling recommendations.    02/18/21, DO [facility]

## 2022-05-20 NOTE — Telephone Encounter (Signed)
Pt aware of $50 No Show fee from missed appt in March 2023.  Informed pt if she misses another appointment without calling to cancel, she will need to pay that balance.  Pt verbalized understanding.

## 2022-05-21 LAB — HEPATITIS C ANTIBODY: Hep C Virus Ab: NONREACTIVE

## 2022-05-21 LAB — HEPATITIS B SURFACE ANTIGEN: Hepatitis B Surface Ag: NEGATIVE

## 2022-05-21 LAB — RPR: RPR Ser Ql: NONREACTIVE

## 2022-05-21 LAB — HIV ANTIBODY (ROUTINE TESTING W REFLEX): HIV Screen 4th Generation wRfx: NONREACTIVE

## 2022-05-27 LAB — CYTOLOGY - PAP
Chlamydia: NEGATIVE
Comment: NEGATIVE
Comment: NEGATIVE
Comment: NEGATIVE
Comment: NEGATIVE
Comment: NEGATIVE
Comment: NORMAL
Diagnosis: NEGATIVE
HPV 16: POSITIVE — AB
HPV 18 / 45: NEGATIVE
High risk HPV: POSITIVE — AB
Neisseria Gonorrhea: NEGATIVE
Trichomonas: NEGATIVE

## 2022-05-28 ENCOUNTER — Telehealth: Payer: Self-pay

## 2022-05-28 NOTE — Telephone Encounter (Signed)
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 

## 2022-05-28 NOTE — Telephone Encounter (Signed)
Unable to leave message Phone just rings. Armandina Stammer RN

## 2022-05-28 NOTE — Telephone Encounter (Signed)
Called patient to inform of abnormal Pap smear results and to schedule her for a Colposcopy. Unable to leave message because vm is not set up. Will call patient on Monday 05/31/22. Manus Weedman l Calaya Gildner, CMA

## 2022-06-01 NOTE — Telephone Encounter (Signed)
A letter was mailed asking patient to call the office to schedule an appointment.  Melissa Henderson l Melissa Henderson, CMA

## 2022-06-07 ENCOUNTER — Telehealth: Payer: Self-pay

## 2022-06-07 NOTE — Telephone Encounter (Signed)
Patient called office after receiving letter from office. Explained abnormal pap smear and need for colposcopy. Patient scheduled for colposcopy. Armandina Stammer  RN

## 2022-06-07 NOTE — Telephone Encounter (Signed)
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

## 2022-07-14 ENCOUNTER — Encounter: Payer: Self-pay | Admitting: Family Medicine

## 2022-07-14 ENCOUNTER — Other Ambulatory Visit (HOSPITAL_COMMUNITY)
Admission: RE | Admit: 2022-07-14 | Discharge: 2022-07-14 | Disposition: A | Discharge status: Home / Self Care | Payer: Medicaid Other | Source: Ambulatory Visit | Attending: Family Medicine | Admitting: Family Medicine

## 2022-07-14 ENCOUNTER — Ambulatory Visit (INDEPENDENT_AMBULATORY_CARE_PROVIDER_SITE_OTHER): Payer: Medicaid Other | Admitting: Family Medicine

## 2022-07-14 VITALS — BP 103/55 | HR 70 | Wt 180.0 lb

## 2022-07-14 DIAGNOSIS — Z01812 Encounter for preprocedural laboratory examination: Secondary | ICD-10-CM | POA: Diagnosis not present

## 2022-07-14 DIAGNOSIS — R87618 Other abnormal cytological findings on specimens from cervix uteri: Secondary | ICD-10-CM

## 2022-07-14 DIAGNOSIS — R87810 Cervical high risk human papillomavirus (HPV) DNA test positive: Secondary | ICD-10-CM

## 2022-07-14 LAB — POCT URINE PREGNANCY: Preg Test, Ur: NEGATIVE

## 2022-07-14 NOTE — Addendum Note (Signed)
Addended by: Levie Heritage on: 07/14/2022 04:09 PM   Modules accepted: Orders

## 2022-07-14 NOTE — Progress Notes (Signed)
Patient Name: Melissa Henderson, female   DOB: [birth year], 36 y.o.  MRN: [id number]  Colposcopy Procedure Note:  [id number] Pregnancy status: Unknown Lab Results  Component Value Date   DIAGPAP  05/20/2022    - Negative for intraepithelial lesion or malignancy (NILM)   DIAGPAP  09/18/2020    - Negative for Intraepithelial Lesions or Malignancy (NILM)   DIAGPAP - Benign reactive/reparative changes 09/18/2020   HPVHIGH Positive (A) 05/20/2022   HPVHIGH Negative 09/18/2020    Cervical History: Previous Abnormal Pap: none Previous Colposcopy: none Previous LEEP or Cryo: none  Smoking: Former Smoker Hysterectomy: No Other History:   Patient given informed consent, signed copy in the chart, time out was performed.    Exam: Vulva and Vagina grossly normal.  Cervix viewed with speculum and colposcope after application of acetic acid:  Cervix Fully Visualized Squamocolumnar Junction Visibility: Fully visualized  Acetowhite lesions: none  Other Lesions: None Punctation: Not present  Mosaicism: Not present Abnormal vasculature: No   Biopsies: none ECC: Yes - Curette and Brush  Hemostasis achieved with:   n/a  Colposcopy Impression:  Benign   Patient was given post procedure instructions.  Will call patient with results.

## 2022-07-16 LAB — SURGICAL PATHOLOGY

## 2022-07-30 ENCOUNTER — Encounter: Payer: Self-pay | Admitting: Family Medicine

## 2022-07-30 DIAGNOSIS — B977 Papillomavirus as the cause of diseases classified elsewhere: Secondary | ICD-10-CM | POA: Insufficient documentation

## 2022-08-02 ENCOUNTER — Telehealth: Payer: Self-pay

## 2022-08-02 NOTE — Telephone Encounter (Signed)
Called patient in regards to Colpo results. Unable to leave message because vm not available. Will try to call the patient back before 5 pm. If no answer, will send letter.  Melissa Henderson l Melissa Henderson, CMA

## 2022-08-02 NOTE — Telephone Encounter (Signed)
-----   Message from Levie Heritage, DO sent at 07/30/2022 12:49 PM EDT ----- Colpo showed some small changes in the cervical canal, but we can follow this with a PAP in 1 year.

## 2022-08-09 ENCOUNTER — Other Ambulatory Visit (HOSPITAL_BASED_OUTPATIENT_CLINIC_OR_DEPARTMENT_OTHER): Payer: Self-pay

## 2022-08-09 ENCOUNTER — Telehealth: Payer: Self-pay

## 2022-08-09 DIAGNOSIS — Z3042 Encounter for surveillance of injectable contraceptive: Secondary | ICD-10-CM

## 2022-08-09 MED ORDER — MEDROXYPROGESTERONE ACETATE 150 MG/ML IM SUSY
150.0000 mg | PREFILLED_SYRINGE | INTRAMUSCULAR | 3 refills | Status: AC
  Filled 2022-08-09: qty 1, 84d supply, fill #0
  Filled 2022-11-02: qty 1, 84d supply, fill #1
  Filled 2023-01-13 – 2023-01-25 (×2): qty 1, 84d supply, fill #2
  Filled 2023-04-19: qty 1, 84d supply, fill #3

## 2022-08-09 NOTE — Telephone Encounter (Signed)
Patient called requesting Colpo results and to confirm NV for Depo injection on 08/10/22. Patient made aware that per Dr. Nehemiah Settle, [facility] showed some small changes in the cervical canal, but we can follow this with a PAP in 1 year. Understanding was voiced. Johnnay Pleitez l Kaymen Adrian, CMA

## 2022-08-10 ENCOUNTER — Ambulatory Visit (INDEPENDENT_AMBULATORY_CARE_PROVIDER_SITE_OTHER): Payer: Medicaid Other

## 2022-08-10 VITALS — BP 117/68 | HR 83 | Wt 180.0 lb

## 2022-08-10 DIAGNOSIS — Z3042 Encounter for surveillance of injectable contraceptive: Secondary | ICD-10-CM | POA: Diagnosis not present

## 2022-08-10 MED ORDER — MEDROXYPROGESTERONE ACETATE 150 MG/ML IM SUSP
150.0000 mg | Freq: Once | INTRAMUSCULAR | Status: AC
  Administered 2022-08-10: 150 mg via INTRAMUSCULAR

## 2022-08-10 NOTE — Progress Notes (Signed)
Melissa Henderson here for Depo-Provera  Injection.  Injection administered without complication. Patient will return in 3 months for next injection.  [name] l Lun Muro, CMA 08/10/2022  10:12 AM

## 2022-11-02 ENCOUNTER — Other Ambulatory Visit (HOSPITAL_BASED_OUTPATIENT_CLINIC_OR_DEPARTMENT_OTHER): Payer: Self-pay

## 2022-11-02 ENCOUNTER — Ambulatory Visit: Payer: Medicaid Other

## 2022-11-02 ENCOUNTER — Other Ambulatory Visit (HOSPITAL_COMMUNITY)
Admission: RE | Admit: 2022-11-02 | Discharge: 2022-11-02 | Disposition: A | Discharge status: Home / Self Care | Payer: Medicaid Other | Source: Ambulatory Visit

## 2022-11-02 DIAGNOSIS — Z3042 Encounter for surveillance of injectable contraceptive: Secondary | ICD-10-CM

## 2022-11-02 DIAGNOSIS — Z113 Encounter for screening for infections with a predominantly sexual mode of transmission: Secondary | ICD-10-CM | POA: Insufficient documentation

## 2022-11-02 MED ORDER — MEDROXYPROGESTERONE ACETATE 150 MG/ML IM SUSP
150.0000 mg | Freq: Once | INTRAMUSCULAR | Status: AC
  Administered 2022-11-02: 150 mg via INTRAMUSCULAR

## 2022-11-02 NOTE — Progress Notes (Signed)
Patient states that she was seen in urgent care and they lost her specimen for self swab.   Patient states that she has had vaginal itching and yellow discharge for approx 15 days. Patient states she was given doxycycline 100 mg but they didn't have results for her. Patient also presents for her Depo Provera injection. Last injection 08-10-22. Last Pap 05-20-22 Armandina Stammer RN

## 2022-11-03 LAB — CERVICOVAGINAL ANCILLARY ONLY
Bacterial Vaginitis (gardnerella): POSITIVE — AB
Candida Glabrata: NEGATIVE
Candida Vaginitis: NEGATIVE
Chlamydia: NEGATIVE
Comment: NEGATIVE
Comment: NEGATIVE
Comment: NEGATIVE
Comment: NEGATIVE
Comment: NEGATIVE
Comment: NORMAL
Neisseria Gonorrhea: NEGATIVE
Trichomonas: POSITIVE — AB

## 2022-11-03 LAB — HIV ANTIBODY (ROUTINE TESTING W REFLEX): HIV Screen 4th Generation wRfx: NONREACTIVE

## 2022-11-03 LAB — RPR: RPR Ser Ql: NONREACTIVE

## 2022-11-03 LAB — HEPATITIS B SURFACE ANTIGEN: Hepatitis B Surface Ag: NEGATIVE

## 2022-11-03 LAB — HEPATITIS C ANTIBODY: Hep C Virus Ab: NONREACTIVE

## 2022-11-04 ENCOUNTER — Telehealth: Payer: Self-pay

## 2022-11-04 DIAGNOSIS — B9689 Other specified bacterial agents as the cause of diseases classified elsewhere: Secondary | ICD-10-CM

## 2022-11-04 DIAGNOSIS — A599 Trichomoniasis, unspecified: Secondary | ICD-10-CM

## 2022-11-04 MED ORDER — METRONIDAZOLE 500 MG PO TABS: 500.0000 mg | ORAL_TABLET | Freq: Two times a day (BID) | ORAL | 0 refills | Status: AC

## 2022-11-04 NOTE — Telephone Encounter (Signed)
Called patient to inform her that she tested positive for BV and Trich. Advised patient to have partner treated and to not have sex for 7 days after she and partner has been treated. Metronidazole 500 mg BID x 7 days was sent to her pharmacy. Understanding was voiced. STD form was faxed to [facility].  Destinae Neubecker l Acy Orsak, CMA

## 2022-11-08 DIAGNOSIS — A599 Trichomoniasis, unspecified: Secondary | ICD-10-CM | POA: Insufficient documentation

## 2023-01-13 ENCOUNTER — Emergency Department (HOSPITAL_BASED_OUTPATIENT_CLINIC_OR_DEPARTMENT_OTHER)
Admission: EM | Admit: 2023-01-13 | Discharge: 2023-01-13 | Disposition: A | Discharge status: Home / Self Care | Payer: Medicaid Other | Attending: Emergency Medicine | Admitting: Emergency Medicine

## 2023-01-13 ENCOUNTER — Emergency Department (HOSPITAL_BASED_OUTPATIENT_CLINIC_OR_DEPARTMENT_OTHER): Payer: Medicaid Other

## 2023-01-13 ENCOUNTER — Other Ambulatory Visit: Payer: Self-pay

## 2023-01-13 ENCOUNTER — Encounter (HOSPITAL_BASED_OUTPATIENT_CLINIC_OR_DEPARTMENT_OTHER): Payer: Self-pay

## 2023-01-13 ENCOUNTER — Other Ambulatory Visit (HOSPITAL_BASED_OUTPATIENT_CLINIC_OR_DEPARTMENT_OTHER): Payer: Self-pay

## 2023-01-13 DIAGNOSIS — X509XXA Other and unspecified overexertion or strenuous movements or postures, initial encounter: Secondary | ICD-10-CM | POA: Insufficient documentation

## 2023-01-13 DIAGNOSIS — S93401A Sprain of unspecified ligament of right ankle, initial encounter: Secondary | ICD-10-CM | POA: Diagnosis not present

## 2023-01-13 DIAGNOSIS — S99911A Unspecified injury of right ankle, initial encounter: Secondary | ICD-10-CM | POA: Diagnosis present

## 2023-01-13 NOTE — ED Provider Notes (Signed)
Melissa Henderson Provider Note   CSN: [id number] Arrival date & time: 01/13/23  0741     History  Chief Complaint  Patient presents with   Ankle Pain    Melissa Henderson is a 37 y.o. female.  37 year old female who presents to the emergency department with right ankle pain.  Patient was going down a set of steps yesterday when she inverted her right ankle and stepped on it on the last stair.  Started immediately developing right ankle pain and swelling.  No other injuries as result of this.  Did soak her ankle in Epsom salts and warm water and elevated it with some improvement of the swelling.  Has been able to bear weight on her toes but due to the pain came in for evaluation.       Home Medications Prior to Admission medications   Medication Sig Start Date End Date Taking? Authorizing Provider  HYDROcodone-acetaminophen (NORCO/VICODIN) 5-325 MG tablet Take 1 tablet by mouth 4 (four) times daily as needed. 06/19/22   [provider]  medroxyPROGESTERone Acetate 150 MG/ML SUSY Inject 1 mL (150 mg total) into the muscle every 3 (three) months. 08/09/22   Radene Gunning, MD  metroNIDAZOLE (FLAGYL) 500 MG tablet Take 1 tablet (500 mg total) by mouth 2 (two) times daily. 11/04/22   Truett Mainland, DO      Allergies    Patient has no known allergies.    Review of Systems   Review of Systems  Physical Exam Updated Vital Signs BP 124/86 (BP Location: Right Arm)   Pulse 73   Temp 98.6 F (37 C) (Oral)   Resp 16   Ht 5' 5"$  (1.651 m)   Wt 83.9 kg   SpO2 98%   BMI 30.79 kg/m  Physical Exam Vitals and nursing note reviewed.  Constitutional:      General: She is not in acute distress.    Appearance: She is well-developed.  HENT:     Head: Normocephalic and atraumatic.     Right Ear: External ear normal.     Left Ear: External ear normal.     Nose: Nose normal.  Eyes:     Extraocular Movements: Extraocular movements intact.      Conjunctiva/sclera: Conjunctivae normal.     Pupils: Pupils are equal, round, and reactive to light.  Pulmonary:     Effort: Pulmonary effort is normal. No respiratory distress.  Abdominal:     General: Abdomen is flat.  Musculoskeletal:     Cervical back: Normal range of motion and neck supple.     Right lower leg: No edema.     Left lower leg: No edema.     Comments: Tenderness to palpation of posterior distal female lateral malleolus.  Small amount of bruising and swelling to lateral malleolus.  No tenderness palpation of proximal fifth metatarsal.  Able to plantarflex ankle no tenderness palpation along Achilles tendon.  Skin:    General: Skin is warm and dry.  Neurological:     Mental Status: She is alert and oriented to person, place, and time. Mental status is at baseline.  Psychiatric:        Mood and Affect: Mood normal.     ED Results / Procedures / Treatments   Labs (all labs ordered are listed, but only abnormal results are displayed) Labs Reviewed - No data to display  EKG None  Radiology DG Ankle Complete Right  Result Date: 01/13/2023 CLINICAL  DATA:  Right ankle pain EXAM: RIGHT ANKLE - COMPLETE 3+ VIEW COMPARISON:  None Available. FINDINGS: There is no evidence of fracture, dislocation, or joint effusion. There is no evidence of arthropathy or other focal bone abnormality. Soft tissues are unremarkable. IMPRESSION: Negative. Electronically Signed   By: Fidela Salisbury M.D.   On: 01/13/2023 08:37    Procedures Procedures   Medications Ordered in ED Medications - No data to display  ED Course/ Medical Decision Making/ A&P                             Medical Decision Making Amount and/or Complexity of Data Reviewed Radiology: ordered.   [name] is a 37 y.o. female who presents with R ankle pain after twisting her ankle  Initial Ddx:  Ankle sprain, distal fibular fracture, Jones/pseudo Jones fracture  MDM:  Feel patient likely has an ankle  sprain given her history and exam.  Will obtain x-rays to assess for distal fibular fracture.  No tenderness to palpation along proximal fifth metatarsal so do not feel a dedicated foot imaging is warranted at this time.  Plan:  Right foot x-ray  ED Summary/Re-evaluation:  X-ray obtained did not show evidence of fracture.  Feel patient likely has ankle sprain so we will treat her symptomatically and given Aircast and crutches to go home with.  Instructed to follow-up with sports medicine in 1 to 2 weeks.  This patient presents to the ED for concern of complaints listed in HPI, this involves an extensive number of treatment options, and is a complaint that carries with it a high risk of complications and morbidity. Disposition including potential need for admission considered.   Dispo: DC Home. Return precautions discussed including, but not limited to, those listed in the AVS. Allowed pt time to ask questions which were answered fully prior to dc.  Records reviewed Outpatient Clinic Notes I independently reviewed the following imaging with scope of interpretation limited to determining acute life threatening conditions related to emergency care: Extremity x-ray(s) and agree with the radiologist interpretation with the following exceptions: none I have reviewed the patients home medications and made adjustments as needed  Final Clinical Impression(s) / ED Diagnoses Final diagnoses:  Sprain of right ankle, unspecified ligament, initial encounter    Rx / DC Orders ED Discharge Orders     None         Fransico Meadow, MD 01/13/23 [phone]

## 2023-01-13 NOTE — Discharge Instructions (Signed)
You were seen for your ankle sprain in the emergency department.   At home, please use the Aircast and crutches we have given you.  You may weight-bear as tolerated.  Use Tylenol and ibuprofen for your pain.  Please elevate your ankle and use ice to limit the swelling.  Check your MyChart online for the results of any tests that had not resulted by the time you left the emergency department.   Please follow-up with sports medicine in 1 to 2 weeks.  Return immediately to the emergency department if you experience any concerning symptoms.  Thank you for visiting our Emergency Department. It was a pleasure taking care of you today.

## 2023-01-13 NOTE — ED Notes (Signed)
Discharge instructions reviewed with patient. Patient verbalizes understanding, no further questions at this time. Medications and follow up information provided. No acute distress noted at time of departure.  

## 2023-01-13 NOTE — ED Triage Notes (Signed)
States twisted right ankle when stepping off her porch yesterday afternoon.

## 2023-01-17 ENCOUNTER — Ambulatory Visit: Payer: Medicaid Other | Admitting: Family Medicine

## 2023-01-25 ENCOUNTER — Ambulatory Visit (INDEPENDENT_AMBULATORY_CARE_PROVIDER_SITE_OTHER): Payer: Medicaid Other

## 2023-01-25 ENCOUNTER — Other Ambulatory Visit (HOSPITAL_BASED_OUTPATIENT_CLINIC_OR_DEPARTMENT_OTHER): Payer: Self-pay

## 2023-01-25 VITALS — BP 115/76 | HR 67 | Wt 185.0 lb

## 2023-01-25 DIAGNOSIS — Z3042 Encounter for surveillance of injectable contraceptive: Secondary | ICD-10-CM

## 2023-01-25 MED ORDER — MEDROXYPROGESTERONE ACETATE 150 MG/ML IM SUSY
150.0000 mg | PREFILLED_SYRINGE | Freq: Once | INTRAMUSCULAR | Status: AC
  Administered 2023-01-25: 150 mg via INTRAMUSCULAR

## 2023-01-25 NOTE — Progress Notes (Signed)
Melissa Henderson here for Depo-Provera  Injection.  Injection administered without complication. Patient will return in 3 months for next injection.  Blen Ransome l Kaho Selle, CMA 01/25/2023  10:53 AM

## 2023-03-09 ENCOUNTER — Encounter: Payer: Self-pay | Admitting: *Deleted

## 2023-04-19 ENCOUNTER — Other Ambulatory Visit (HOSPITAL_COMMUNITY)
Admission: RE | Admit: 2023-04-19 | Discharge: 2023-04-19 | Disposition: A | Discharge status: Home / Self Care | Payer: Medicaid Other | Source: Ambulatory Visit | Attending: Family Medicine | Admitting: Family Medicine

## 2023-04-19 ENCOUNTER — Ambulatory Visit (INDEPENDENT_AMBULATORY_CARE_PROVIDER_SITE_OTHER): Payer: Medicaid Other

## 2023-04-19 ENCOUNTER — Other Ambulatory Visit (HOSPITAL_BASED_OUTPATIENT_CLINIC_OR_DEPARTMENT_OTHER): Payer: Self-pay

## 2023-04-19 VITALS — BP 125/70 | HR 76 | Ht 65.0 in

## 2023-04-19 DIAGNOSIS — N949 Unspecified condition associated with female genital organs and menstrual cycle: Secondary | ICD-10-CM

## 2023-04-19 DIAGNOSIS — Z3042 Encounter for surveillance of injectable contraceptive: Secondary | ICD-10-CM

## 2023-04-19 NOTE — Progress Notes (Signed)
Date last pap: 05-20-22. Last Depo-Provera: 01-25-23. Side Effects if any: none. Serum HCG indicated? NA. Depo-Provera 150 mg IM given by: Burnard Leigh RN. Next appointment due 3 months.   Patient requests self swab for std testing. Patient collected and let her know that we will have results in approx 48hours. Patient denies blood testing. Armandina Stammer RN

## 2023-04-21 LAB — CERVICOVAGINAL ANCILLARY ONLY
Bacterial Vaginitis (gardnerella): NEGATIVE
Chlamydia: NEGATIVE
Comment: NEGATIVE
Comment: NEGATIVE
Comment: NEGATIVE
Comment: NORMAL
Neisseria Gonorrhea: NEGATIVE
Trichomonas: NEGATIVE

## 2023-07-05 ENCOUNTER — Other Ambulatory Visit: Payer: Self-pay

## 2023-07-05 ENCOUNTER — Ambulatory Visit (INDEPENDENT_AMBULATORY_CARE_PROVIDER_SITE_OTHER): Payer: Medicaid Other

## 2023-07-05 ENCOUNTER — Other Ambulatory Visit: Payer: Self-pay | Admitting: Obstetrics and Gynecology

## 2023-07-05 ENCOUNTER — Other Ambulatory Visit (HOSPITAL_BASED_OUTPATIENT_CLINIC_OR_DEPARTMENT_OTHER): Payer: Self-pay

## 2023-07-05 VITALS — BP 122/71 | HR 72 | Wt 196.0 lb

## 2023-07-05 DIAGNOSIS — Z3042 Encounter for surveillance of injectable contraceptive: Secondary | ICD-10-CM

## 2023-07-05 DIAGNOSIS — Z30013 Encounter for initial prescription of injectable contraceptive: Secondary | ICD-10-CM

## 2023-07-05 MED ORDER — MEDROXYPROGESTERONE ACETATE 150 MG/ML IM SUSY
150.0000 mg | PREFILLED_SYRINGE | INTRAMUSCULAR | 3 refills | Status: AC
  Filled 2023-07-05: qty 1, 90d supply, fill #0
  Filled 2023-09-28: qty 1, 90d supply, fill #1
  Filled 2024-02-10: qty 1, 90d supply, fill #2

## 2023-07-05 MED ORDER — MEDROXYPROGESTERONE ACETATE 150 MG/ML IM SUSP
150.0000 mg | Freq: Once | INTRAMUSCULAR | Status: AC
  Administered 2023-07-05: 150 mg via INTRAMUSCULAR

## 2023-07-05 NOTE — Progress Notes (Addendum)
Melissa Henderson here for Depo-Provera  Injection.  Injection administered without complication. Patient will return in 3 months for next injection.  Antwuan Eckley l Donald Jacque, CMA 07/05/2023  9:37 AM

## 2023-08-18 ENCOUNTER — Other Ambulatory Visit (HOSPITAL_COMMUNITY)
Admission: RE | Admit: 2023-08-18 | Discharge: 2023-08-18 | Disposition: A | Discharge status: Home / Self Care | Payer: Medicaid Other | Source: Ambulatory Visit | Attending: Family Medicine | Admitting: Family Medicine

## 2023-08-18 ENCOUNTER — Encounter: Payer: Self-pay | Admitting: Family Medicine

## 2023-08-18 ENCOUNTER — Ambulatory Visit (INDEPENDENT_AMBULATORY_CARE_PROVIDER_SITE_OTHER): Payer: Medicaid Other | Admitting: Family Medicine

## 2023-08-18 VITALS — BP 122/83 | HR 67 | Ht 65.0 in | Wt 194.0 lb

## 2023-08-18 DIAGNOSIS — Z01419 Encounter for gynecological examination (general) (routine) without abnormal findings: Secondary | ICD-10-CM

## 2023-08-18 DIAGNOSIS — Z7721 Contact with and (suspected) exposure to potentially hazardous body fluids: Secondary | ICD-10-CM | POA: Diagnosis not present

## 2023-08-18 DIAGNOSIS — Z1339 Encounter for screening examination for other mental health and behavioral disorders: Secondary | ICD-10-CM | POA: Diagnosis not present

## 2023-08-18 DIAGNOSIS — B977 Papillomavirus as the cause of diseases classified elsewhere: Secondary | ICD-10-CM

## 2023-08-18 NOTE — Progress Notes (Signed)
ANNUAL EXAM Patient name: Melissa Henderson MRN [id number]  Date of birth: Dec 20, 1985 Chief Complaint:   Annual Exam  History of Present Illness:   Melissa Henderson is a 37 y.o.  [phone]  female  being seen today for a routine annual exam.  Current complaints: none  No LMP recorded. Patient has had an injection.  Last pap 2023. Results were: NILM w/ HRHPV positive: 16. H/O abnormal pap: no Last mammogram: n/a.     08/18/2023   10:37 AM  Depression screen PHQ 2/9  Decreased Interest 1  Down, Depressed, Hopeless 1  PHQ - 2 Score 2  Altered sleeping 0  Tired, decreased energy 2  Change in appetite 1  Feeling bad or failure about yourself  1  Trouble concentrating 0  Moving slowly or fidgety/restless 0  Suicidal thoughts 0  PHQ-9 Score 6        08/18/2023   10:37 AM  GAD 7 : Generalized Anxiety Score  Nervous, Anxious, on Edge 0  Control/stop worrying 0  Worry too much - different things 0  Trouble relaxing 0  Restless 0  Easily annoyed or irritable 0  Afraid - awful might happen 0  Total GAD 7 Score 0     Review of Systems:   Pertinent items are noted in HPI Denies any headaches, blurred vision, fatigue, shortness of breath, chest pain, abdominal pain, abnormal vaginal discharge/itching/odor/irritation, problems with periods, bowel movements, urination, or intercourse unless otherwise stated above. Pertinent History Reviewed:  Reviewed past medical,surgical, social and family history.  Reviewed problem list, medications and allergies. Physical Assessment:   Vitals:   08/18/23 1037  BP: 122/83  Pulse: 67  Weight: 194 lb (88 kg)  Height: 5\' 5"  (1.651 m)  Body mass index is 32.28 kg/m.        Physical Examination:   General appearance - well appearing, and in no distress  Mental status - alert, oriented to person, place, and time  Psych:  She has a normal mood and affect  Skin - warm and dry, normal color, no suspicious lesions noted  Chest - effort normal, all  lung fields clear to auscultation bilaterally  Heart - normal rate and regular rhythm  Neck:  midline trachea, no thyromegaly or nodules  Breasts - breasts appear normal, no suspicious masses, no skin or nipple changes or axillary nodes  Abdomen - soft, nontender, nondistended, no masses or organomegaly  Pelvic - VULVA: normal appearing vulva with no masses, tenderness or lesions  VAGINA: normal appearing vagina with normal color and discharge, no lesions  CERVIX: normal appearing cervix without discharge or lesions, no CMT  Thin prep pap is done with HR HPV cotesting  UTERUS: uterus is felt to be normal size, shape, consistency and nontender   ADNEXA: No adnexal masses or tenderness noted.  Extremities:  No swelling or varicosities noted  Chaperone present for exam  Assessment & Plan:  1. Well woman exam with routine gynecological exam - Cytology - PAP - TSH - Hemoglobin A1c - CBC  2. Exposure to body fluid - Hepatitis B surface antigen - Hepatitis C antibody - HIV Antibody (routine testing w rflx) - RPR  3. 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  Neg Colpo Recheck PAP    Labs/procedures today:   Orders Placed This Encounter  Procedures   Hepatitis B surface antigen   Hepatitis C antibody   HIV Antibody (routine testing w rflx)   RPR   TSH  Hemoglobin A1c   CBC    Meds: No orders of the defined types were placed in this encounter.   Follow-up: No follow-ups on file.  Levie Heritage, DO 08/18/2023 11:24 AM

## 2023-08-19 LAB — HIV ANTIBODY (ROUTINE TESTING W REFLEX): HIV Screen 4th Generation wRfx: NONREACTIVE

## 2023-08-19 LAB — HEPATITIS B SURFACE ANTIGEN: Hepatitis B Surface Ag: NEGATIVE

## 2023-08-19 LAB — CBC
Hematocrit: 40.3 % (ref 34.0–46.6)
Hemoglobin: 12.5 g/dL (ref 11.1–15.9)
MCH: 28.3 pg (ref 26.6–33.0)
MCHC: 31 g/dL — ABNORMAL LOW (ref 31.5–35.7)
MCV: 91 fL (ref 79–97)
Platelets: 252 10*3/uL (ref 150–450)
RBC: 4.42 x10E6/uL (ref 3.77–5.28)
RDW: 12 % (ref 11.7–15.4)
WBC: 5.3 10*3/uL (ref 3.4–10.8)

## 2023-08-19 LAB — RPR: RPR Ser Ql: NONREACTIVE

## 2023-08-19 LAB — TSH: TSH: 1.01 u[IU]/mL (ref 0.450–4.500)

## 2023-08-19 LAB — HEMOGLOBIN A1C
Est. average glucose Bld gHb Est-mCnc: 114 mg/dL
Hgb A1c MFr Bld: 5.6 % (ref 4.8–5.6)

## 2023-08-19 LAB — HEPATITIS C ANTIBODY: Hep C Virus Ab: NONREACTIVE

## 2023-08-25 ENCOUNTER — Encounter: Payer: Self-pay | Admitting: Family Medicine

## 2023-08-29 LAB — CYTOLOGY - PAP
Chlamydia: NEGATIVE
Comment: NEGATIVE
Comment: NEGATIVE
Comment: NEGATIVE
Comment: NORMAL
Diagnosis: NEGATIVE
Diagnosis: REACTIVE
High risk HPV: NEGATIVE
Neisseria Gonorrhea: NEGATIVE
Trichomonas: NEGATIVE

## 2023-09-27 ENCOUNTER — Ambulatory Visit: Payer: Medicaid Other

## 2023-09-28 ENCOUNTER — Other Ambulatory Visit (HOSPITAL_COMMUNITY)
Admission: RE | Admit: 2023-09-28 | Discharge: 2023-09-28 | Disposition: A | Discharge status: Home / Self Care | Payer: Medicaid Other | Source: Ambulatory Visit | Attending: Obstetrics & Gynecology | Admitting: Obstetrics & Gynecology

## 2023-09-28 ENCOUNTER — Other Ambulatory Visit (HOSPITAL_BASED_OUTPATIENT_CLINIC_OR_DEPARTMENT_OTHER): Payer: Self-pay

## 2023-09-28 ENCOUNTER — Ambulatory Visit (INDEPENDENT_AMBULATORY_CARE_PROVIDER_SITE_OTHER): Payer: Medicaid Other

## 2023-09-28 VITALS — BP 139/92 | HR 60 | Wt 193.0 lb

## 2023-09-28 DIAGNOSIS — N898 Other specified noninflammatory disorders of vagina: Secondary | ICD-10-CM

## 2023-09-28 DIAGNOSIS — Z3042 Encounter for surveillance of injectable contraceptive: Secondary | ICD-10-CM

## 2023-09-28 MED ORDER — MEDROXYPROGESTERONE ACETATE 150 MG/ML IM SUSP
150.0000 mg | Freq: Once | INTRAMUSCULAR | Status: AC
  Administered 2023-09-28: 150 mg via INTRAMUSCULAR

## 2023-09-28 NOTE — Progress Notes (Addendum)
Melissa Henderson here for Depo-Provera  Injection.  Injection administered without complication. Patient will return in 3 months for next injection.   Patient also states she is having white discharge x 1.5 weeks with odor. Patient wants to be checked for STDs. Self swab was sent to the lab.  Lashunta Frieden l Ilanna Deihl, CMA 09/28/2023  3:00 PM    Attestation of Attending Supervision of CMA/RN: Evaluation and management procedures were performed by the nurse under my supervision and collaboration.  I have reviewed the nursing note and chart, and I agree with the management and plan.  Carolyn L. Harraway-Smith, M.D., [facility] Core

## 2023-09-29 ENCOUNTER — Ambulatory Visit: Payer: Medicaid Other

## 2023-09-30 ENCOUNTER — Encounter: Payer: Self-pay | Admitting: Family Medicine

## 2023-09-30 LAB — CERVICOVAGINAL ANCILLARY ONLY
Bacterial Vaginitis (gardnerella): POSITIVE — AB
Candida Glabrata: NEGATIVE
Candida Vaginitis: NEGATIVE
Chlamydia: POSITIVE — AB
Comment: NEGATIVE
Comment: NEGATIVE
Comment: NEGATIVE
Comment: NEGATIVE
Comment: NEGATIVE
Comment: NORMAL
Neisseria Gonorrhea: NEGATIVE
Trichomonas: NEGATIVE

## 2023-10-03 ENCOUNTER — Other Ambulatory Visit: Payer: Self-pay

## 2023-10-03 DIAGNOSIS — N760 Acute vaginitis: Secondary | ICD-10-CM

## 2023-10-03 DIAGNOSIS — A749 Chlamydial infection, unspecified: Secondary | ICD-10-CM

## 2023-10-03 MED ORDER — METRONIDAZOLE 500 MG PO TABS: 500.0000 mg | ORAL_TABLET | Freq: Two times a day (BID) | ORAL | 0 refills | Status: AC

## 2023-10-03 MED ORDER — DOXYCYCLINE HYCLATE 100 MG PO CAPS: 100.0000 mg | ORAL_CAPSULE | Freq: Two times a day (BID) | ORAL | 0 refills | Status: AC

## 2023-10-03 NOTE — Progress Notes (Signed)
Patient sent Mychart message requesting a Rx for BV and Chlamydia. Doxycycline 100 mg PO BID x 7 days and Flagyl 500 mg PO BID x 7 days was sent to her pharmacy. A message will be sent to the patient.  Kobyn Kray l Konner Saiz, CMA

## 2023-10-14 ENCOUNTER — Other Ambulatory Visit: Payer: Self-pay | Admitting: Obstetrics and Gynecology

## 2023-10-14 MED ORDER — METRONIDAZOLE 500 MG PO TABS: 500.0000 mg | ORAL_TABLET | Freq: Two times a day (BID) | ORAL | 0 refills | Status: AC

## 2023-10-14 MED ORDER — DOXYCYCLINE HYCLATE 100 MG PO CAPS: 100.0000 mg | ORAL_CAPSULE | Freq: Two times a day (BID) | ORAL | 0 refills | Status: AC

## 2023-12-19 ENCOUNTER — Ambulatory Visit: Payer: Medicaid Other

## 2024-02-10 ENCOUNTER — Other Ambulatory Visit (HOSPITAL_COMMUNITY)
Admission: RE | Admit: 2024-02-10 | Discharge: 2024-02-10 | Disposition: A | Discharge status: Home / Self Care | Source: Ambulatory Visit | Attending: Obstetrics & Gynecology | Admitting: Obstetrics & Gynecology

## 2024-02-10 ENCOUNTER — Other Ambulatory Visit (HOSPITAL_BASED_OUTPATIENT_CLINIC_OR_DEPARTMENT_OTHER): Payer: Self-pay

## 2024-02-10 ENCOUNTER — Ambulatory Visit

## 2024-02-10 VITALS — BP 121/84 | HR 70 | Wt 189.0 lb

## 2024-02-10 DIAGNOSIS — Z3202 Encounter for pregnancy test, result negative: Secondary | ICD-10-CM

## 2024-02-10 DIAGNOSIS — Z3042 Encounter for surveillance of injectable contraceptive: Secondary | ICD-10-CM | POA: Diagnosis not present

## 2024-02-10 DIAGNOSIS — Z113 Encounter for screening for infections with a predominantly sexual mode of transmission: Secondary | ICD-10-CM | POA: Insufficient documentation

## 2024-02-10 LAB — POCT URINE PREGNANCY: Preg Test, Ur: NEGATIVE

## 2024-02-10 MED ORDER — MEDROXYPROGESTERONE ACETATE 150 MG/ML IM SUSY
150.0000 mg | PREFILLED_SYRINGE | Freq: Once | INTRAMUSCULAR | Status: AC
  Administered 2024-02-10: 150 mg via INTRAMUSCULAR

## 2024-02-10 NOTE — Progress Notes (Addendum)
 Date last pap: 08/18/2023. Last Depo-Provera: 09/28/2023 Side Effects if any: N/A. Serum HCG indicated? Negative . Depo-Provera 150 mg IM given by: Lorelle Gibbs, CMA. Next appointment due 05/04/2024.   Patient states she has not had unprotected sex in the last 2 weeks. Patient also requests STI screening.  Christina Gintz l Ryiah Bellissimo, CMA

## 2024-02-14 ENCOUNTER — Encounter: Payer: Self-pay | Admitting: Obstetrics & Gynecology

## 2024-02-14 LAB — CERVICOVAGINAL ANCILLARY ONLY
Chlamydia: NEGATIVE
Comment: NEGATIVE
Comment: NEGATIVE
Comment: NORMAL
Neisseria Gonorrhea: NEGATIVE
Trichomonas: NEGATIVE

## 2024-05-04 ENCOUNTER — Ambulatory Visit
